# Patient Record
Sex: Female | Born: 1950 | Race: White | Hispanic: No | Marital: Married | Smoking: Never smoker
Health system: Southern US, Community
[De-identification: ages and names within clinical notes are randomized; demographics above are authoritative.]

## PROBLEM LIST (undated history)

## (undated) DIAGNOSIS — T7840XA Allergy, unspecified, initial encounter: Secondary | ICD-10-CM

## (undated) DIAGNOSIS — J309 Allergic rhinitis, unspecified: Secondary | ICD-10-CM

## (undated) DIAGNOSIS — H269 Unspecified cataract: Secondary | ICD-10-CM

## (undated) DIAGNOSIS — M1990 Unspecified osteoarthritis, unspecified site: Secondary | ICD-10-CM

## (undated) DIAGNOSIS — D649 Anemia, unspecified: Secondary | ICD-10-CM

## (undated) DIAGNOSIS — R011 Cardiac murmur, unspecified: Secondary | ICD-10-CM

## (undated) HISTORY — PX: TOTAL ABDOMINAL HYSTERECTOMY W/ BILATERAL SALPINGOOPHORECTOMY: SHX83

## (undated) HISTORY — DX: Allergy, unspecified, initial encounter: T78.40XA

## (undated) HISTORY — PX: BREAST LUMPECTOMY: SHX2

## (undated) HISTORY — DX: Anemia, unspecified: D64.9

## (undated) HISTORY — DX: Cardiac murmur, unspecified: R01.1

## (undated) HISTORY — DX: Unspecified cataract: H26.9

## (undated) HISTORY — DX: Allergic rhinitis, unspecified: J30.9

## (undated) HISTORY — DX: Unspecified osteoarthritis, unspecified site: M19.90

## (undated) HISTORY — PX: EYE SURGERY: SHX253

## (undated) HISTORY — PX: ABDOMINAL HYSTERECTOMY: SHX81

## (undated) HISTORY — PX: MYRINGOTOMY: SHX2060

---

## 1998-06-11 ENCOUNTER — Other Ambulatory Visit: Admission: RE | Admit: 1998-06-11 | Discharge: 1998-06-11 | Payer: Self-pay | Admitting: Obstetrics and Gynecology

## 1998-12-31 ENCOUNTER — Emergency Department (HOSPITAL_COMMUNITY): Admission: EM | Admit: 1998-12-31 | Discharge: 1998-12-31 | Payer: Self-pay | Admitting: Unknown Physician Specialty

## 1999-06-10 ENCOUNTER — Encounter: Admission: RE | Admit: 1999-06-10 | Discharge: 1999-06-10 | Payer: Self-pay | Admitting: Obstetrics and Gynecology

## 1999-06-10 ENCOUNTER — Encounter: Payer: Self-pay | Admitting: Obstetrics and Gynecology

## 1999-06-14 ENCOUNTER — Other Ambulatory Visit: Admission: RE | Admit: 1999-06-14 | Discharge: 1999-06-14 | Payer: Self-pay | Admitting: Obstetrics and Gynecology

## 2000-06-12 ENCOUNTER — Encounter: Payer: Self-pay | Admitting: Obstetrics and Gynecology

## 2000-06-12 ENCOUNTER — Encounter: Admission: RE | Admit: 2000-06-12 | Discharge: 2000-06-12 | Payer: Self-pay | Admitting: Obstetrics and Gynecology

## 2000-06-15 ENCOUNTER — Encounter: Payer: Self-pay | Admitting: Obstetrics and Gynecology

## 2000-06-15 ENCOUNTER — Encounter: Admission: RE | Admit: 2000-06-15 | Discharge: 2000-06-15 | Payer: Self-pay | Admitting: Obstetrics and Gynecology

## 2000-06-19 ENCOUNTER — Other Ambulatory Visit: Admission: RE | Admit: 2000-06-19 | Discharge: 2000-06-19 | Payer: Self-pay | Admitting: Family Medicine

## 2001-06-21 ENCOUNTER — Encounter: Payer: Self-pay | Admitting: Obstetrics and Gynecology

## 2001-06-21 ENCOUNTER — Encounter: Admission: RE | Admit: 2001-06-21 | Discharge: 2001-06-21 | Payer: Self-pay | Admitting: Obstetrics and Gynecology

## 2001-07-23 ENCOUNTER — Other Ambulatory Visit: Admission: RE | Admit: 2001-07-23 | Discharge: 2001-07-23 | Payer: Self-pay | Admitting: Obstetrics and Gynecology

## 2001-12-06 ENCOUNTER — Ambulatory Visit (HOSPITAL_COMMUNITY): Admission: RE | Admit: 2001-12-06 | Discharge: 2001-12-06 | Payer: Self-pay | Admitting: Gastroenterology

## 2001-12-06 ENCOUNTER — Encounter (INDEPENDENT_AMBULATORY_CARE_PROVIDER_SITE_OTHER): Payer: Self-pay | Admitting: *Deleted

## 2002-05-20 ENCOUNTER — Other Ambulatory Visit: Admission: RE | Admit: 2002-05-20 | Discharge: 2002-05-20 | Payer: Self-pay | Admitting: Obstetrics and Gynecology

## 2002-07-09 ENCOUNTER — Encounter: Payer: Self-pay | Admitting: Obstetrics and Gynecology

## 2002-07-09 ENCOUNTER — Encounter: Admission: RE | Admit: 2002-07-09 | Discharge: 2002-07-09 | Payer: Self-pay | Admitting: Obstetrics and Gynecology

## 2002-08-22 ENCOUNTER — Ambulatory Visit (HOSPITAL_COMMUNITY): Admission: RE | Admit: 2002-08-22 | Discharge: 2002-08-22 | Payer: Self-pay | Admitting: *Deleted

## 2002-08-22 ENCOUNTER — Encounter (INDEPENDENT_AMBULATORY_CARE_PROVIDER_SITE_OTHER): Payer: Self-pay | Admitting: *Deleted

## 2003-07-31 ENCOUNTER — Encounter: Admission: RE | Admit: 2003-07-31 | Discharge: 2003-07-31 | Payer: Self-pay | Admitting: Obstetrics and Gynecology

## 2003-08-04 ENCOUNTER — Encounter: Admission: RE | Admit: 2003-08-04 | Discharge: 2003-08-04 | Payer: Self-pay | Admitting: Obstetrics and Gynecology

## 2003-08-06 ENCOUNTER — Other Ambulatory Visit: Admission: RE | Admit: 2003-08-06 | Discharge: 2003-08-06 | Payer: Self-pay | Admitting: Obstetrics and Gynecology

## 2003-09-22 ENCOUNTER — Inpatient Hospital Stay (HOSPITAL_COMMUNITY): Admission: RE | Admit: 2003-09-22 | Discharge: 2003-09-25 | Payer: Self-pay | Admitting: Obstetrics and Gynecology

## 2003-09-22 ENCOUNTER — Encounter (INDEPENDENT_AMBULATORY_CARE_PROVIDER_SITE_OTHER): Payer: Self-pay | Admitting: Specialist

## 2004-03-11 ENCOUNTER — Ambulatory Visit: Payer: Self-pay | Admitting: Family Medicine

## 2004-08-19 ENCOUNTER — Encounter: Admission: RE | Admit: 2004-08-19 | Discharge: 2004-08-19 | Payer: Self-pay | Admitting: Obstetrics and Gynecology

## 2005-02-17 ENCOUNTER — Ambulatory Visit: Payer: Self-pay | Admitting: Gastroenterology

## 2005-02-24 ENCOUNTER — Ambulatory Visit: Payer: Self-pay | Admitting: Gastroenterology

## 2005-02-24 LAB — HM COLONOSCOPY: HM Colonoscopy: NORMAL

## 2005-07-25 ENCOUNTER — Ambulatory Visit: Payer: Self-pay | Admitting: Family Medicine

## 2005-08-21 ENCOUNTER — Encounter: Admission: RE | Admit: 2005-08-21 | Discharge: 2005-08-21 | Payer: Self-pay | Admitting: Obstetrics and Gynecology

## 2006-02-05 ENCOUNTER — Ambulatory Visit: Payer: Self-pay | Admitting: Family Medicine

## 2006-08-24 ENCOUNTER — Encounter: Admission: RE | Admit: 2006-08-24 | Discharge: 2006-08-24 | Payer: Self-pay | Admitting: Obstetrics and Gynecology

## 2007-07-09 ENCOUNTER — Ambulatory Visit: Payer: Self-pay | Admitting: Family Medicine

## 2007-07-09 DIAGNOSIS — J309 Allergic rhinitis, unspecified: Secondary | ICD-10-CM | POA: Insufficient documentation

## 2007-08-26 ENCOUNTER — Encounter: Admission: RE | Admit: 2007-08-26 | Discharge: 2007-08-26 | Payer: Self-pay | Admitting: Obstetrics and Gynecology

## 2007-11-22 ENCOUNTER — Ambulatory Visit: Payer: Self-pay | Admitting: Family Medicine

## 2007-11-22 LAB — CONVERTED CEMR LAB
Bilirubin Urine: NEGATIVE
Blood in Urine, dipstick: NEGATIVE
Glucose, Urine, Semiquant: NEGATIVE
Ketones, urine, test strip: NEGATIVE
Nitrite: NEGATIVE
pH: 7.5

## 2007-11-24 ENCOUNTER — Telehealth (INDEPENDENT_AMBULATORY_CARE_PROVIDER_SITE_OTHER): Payer: Self-pay | Admitting: *Deleted

## 2007-11-24 LAB — CONVERTED CEMR LAB
Bilirubin, Direct: 0.1 mg/dL (ref 0.0–0.3)
CO2: 29 meq/L (ref 19–32)
Calcium: 8.9 mg/dL (ref 8.4–10.5)
Chloride: 100 meq/L (ref 96–112)
GFR calc non Af Amer: 110 mL/min
Glucose, Bld: 83 mg/dL (ref 70–99)
Hemoglobin: 14.6 g/dL (ref 12.0–15.0)
Lymphocytes Relative: 24.1 % (ref 12.0–46.0)
Monocytes Relative: 9.3 % (ref 3.0–12.0)
Platelets: 249 10*3/uL (ref 150–400)
Potassium: 3.7 meq/L (ref 3.5–5.1)
RBC: 4.75 M/uL (ref 3.87–5.11)
RDW: 13.3 % (ref 11.5–14.6)
Total Bilirubin: 0.9 mg/dL (ref 0.3–1.2)
Total CHOL/HDL Ratio: 2
Triglycerides: 37 mg/dL (ref 0–149)
VLDL: 7 mg/dL (ref 0–40)
WBC: 4.8 10*3/uL (ref 4.5–10.5)

## 2007-12-05 ENCOUNTER — Encounter (INDEPENDENT_AMBULATORY_CARE_PROVIDER_SITE_OTHER): Payer: Self-pay | Admitting: *Deleted

## 2007-12-05 ENCOUNTER — Ambulatory Visit: Payer: Self-pay | Admitting: Family Medicine

## 2007-12-05 LAB — CONVERTED CEMR LAB: OCCULT 2: NEGATIVE

## 2008-01-29 ENCOUNTER — Telehealth (INDEPENDENT_AMBULATORY_CARE_PROVIDER_SITE_OTHER): Payer: Self-pay | Admitting: *Deleted

## 2008-01-31 ENCOUNTER — Telehealth (INDEPENDENT_AMBULATORY_CARE_PROVIDER_SITE_OTHER): Payer: Self-pay | Admitting: *Deleted

## 2008-08-17 ENCOUNTER — Telehealth (INDEPENDENT_AMBULATORY_CARE_PROVIDER_SITE_OTHER): Payer: Self-pay | Admitting: *Deleted

## 2008-09-11 ENCOUNTER — Encounter: Admission: RE | Admit: 2008-09-11 | Discharge: 2008-09-11 | Payer: Self-pay | Admitting: Obstetrics and Gynecology

## 2008-11-27 ENCOUNTER — Ambulatory Visit: Payer: Self-pay | Admitting: Family Medicine

## 2008-11-27 DIAGNOSIS — J019 Acute sinusitis, unspecified: Secondary | ICD-10-CM

## 2008-11-27 DIAGNOSIS — R05 Cough: Secondary | ICD-10-CM

## 2008-11-30 ENCOUNTER — Ambulatory Visit (HOSPITAL_BASED_OUTPATIENT_CLINIC_OR_DEPARTMENT_OTHER): Admission: RE | Admit: 2008-11-30 | Discharge: 2008-11-30 | Payer: Self-pay | Admitting: Family Medicine

## 2008-11-30 ENCOUNTER — Ambulatory Visit: Payer: Self-pay | Admitting: Radiology

## 2008-11-30 ENCOUNTER — Telehealth (INDEPENDENT_AMBULATORY_CARE_PROVIDER_SITE_OTHER): Payer: Self-pay | Admitting: *Deleted

## 2009-02-15 ENCOUNTER — Ambulatory Visit: Payer: Self-pay | Admitting: Family Medicine

## 2009-03-01 ENCOUNTER — Encounter (INDEPENDENT_AMBULATORY_CARE_PROVIDER_SITE_OTHER): Payer: Self-pay | Admitting: *Deleted

## 2009-03-01 LAB — CONVERTED CEMR LAB
Basophils Absolute: 0 10*3/uL (ref 0.0–0.1)
Bilirubin, Direct: 0 mg/dL (ref 0.0–0.3)
CO2: 28 meq/L (ref 19–32)
Creatinine, Ser: 0.6 mg/dL (ref 0.4–1.2)
Eosinophils Absolute: 0.2 10*3/uL (ref 0.0–0.7)
Eosinophils Relative: 2.2 % (ref 0.0–5.0)
Glucose, Bld: 97 mg/dL (ref 70–99)
HCT: 39.6 % (ref 36.0–46.0)
HDL: 75.2 mg/dL (ref >39.00)
LDL Cholesterol: 75 mg/dL (ref 0–99)
Lymphs Abs: 0.9 10*3/uL (ref 0.7–4.0)
MCHC: 34.6 g/dL (ref 30.0–36.0)
Monocytes Relative: 6.9 % (ref 3.0–12.0)
Neutro Abs: 6.9 10*3/uL (ref 1.4–7.7)
Platelets: 224 10*3/uL (ref 150.0–400.0)
Potassium: 4.3 meq/L (ref 3.5–5.1)
RBC: 4.26 M/uL (ref 3.87–5.11)
TSH: 1.75 microintl units/mL (ref 0.35–5.50)
Total CHOL/HDL Ratio: 2
Total Protein: 6.5 g/dL (ref 6.0–8.3)
VLDL: 6.6 mg/dL (ref 0.0–40.0)

## 2009-03-16 ENCOUNTER — Ambulatory Visit: Payer: Self-pay | Admitting: Family Medicine

## 2009-03-16 LAB — CONVERTED CEMR LAB
OCCULT 2: NEGATIVE
OCCULT 3: NEGATIVE

## 2009-09-17 ENCOUNTER — Encounter: Admission: RE | Admit: 2009-09-17 | Discharge: 2009-09-17 | Payer: Self-pay | Admitting: Obstetrics and Gynecology

## 2010-01-28 ENCOUNTER — Ambulatory Visit: Payer: Self-pay | Admitting: Family Medicine

## 2010-03-30 ENCOUNTER — Ambulatory Visit: Payer: Self-pay | Admitting: Family Medicine

## 2010-05-24 NOTE — Assessment & Plan Note (Signed)
Summary: sore throat.temp/congested/cbs   Vital Signs:  Patient profile:   59 year old female Weight:      153 pounds BMI:     24.78 Temp:     99.7 degrees F oral BP sitting:   128 / 82  (left arm)  Vitals Entered By: Doristine Devoid CMA (March 30, 2010 4:29 PM) CC: sinus congestion and pressure along w/ HA and cough   History of Present Illness: 60 yo woman here today for ? sinus infxn.  started as a dry cough last week.  by Monday had increased head congestion.  yesterday lost her voice.  now w/ chills and facial pain.  no tooth pain.  + ear clogged.  sore throat when coughing.  cough is increasingly more productive.  + sick contacts.  Current Medications (verified): 1)  Adult Aspirin Low Strength 81 Mg  Tbdp (Aspirin) .[name].. 1 By Mouth Once Daily 2)  Flonase 50 Mcg/act  Susp (Fluticasone Propionate) .[name].. 1 Spray Each Nostril Two Times A Day 3)  Alora 0.075 Mg/24hr Pttw (Estradiol) .... As Directed  Allergies (verified): 1)  ! Sulfa  Review of Systems      See HPI  Physical Exam  General:  Well-developed,well-nourished,in no acute distress; alert,appropriate and cooperative throughout examination Head:  Normocephalic and atraumatic without obvious abnormalities. No apparent alopecia or balding.  + TTP over maxillary sinuses Eyes:  no injxn or inflammation Ears:  TMs retracted bilaterally Nose:  + congestion Mouth:  + PND Neck:  No deformities, masses, or tenderness noted. Lungs:  Normal respiratory effort, chest expands symmetrically. Lungs are clear to auscultation, no crackles or wheezes.  hacking cough Heart:  Normal rate and regular rhythm. S1 and S2 normal without gallop, murmur, click, rub or other extra sounds.   Impression & Recommendations:  Problem # 1:  ACUTE SINUSITIS, UNSPECIFIED (ICD-461.9) Assessment Unchanged start Zpack at pt's request.  cough meds as needed.  reviewed supportive care and red flags that should prompt return.  Pt expresses understanding  and is in agreement w/ this plan. Her updated medication list for this problem includes:    Flonase 50 Mcg/act Susp (Fluticasone propionate) .[name]... 1 spray each nostril two times a day    Azithromycin 250 Mg Tabs (Azithromycin) .[name]... 2 by  mouth today and then 1 daily for 4 days    Tessalon 200 Mg Caps (Benzonatate) .[name]... Take one capsule by mouth three times a day as needed for cough    Cheratussin Ac 100-10 Mg/66ml Syrp (Guaifenesin-codeine) .[name]... 1-2 tsps q4-6 as needed for cough  Complete Medication List: 1)  Adult Aspirin Low Strength 81 Mg Tbdp (Aspirin) .[name].. 1 by mouth once daily 2)  Flonase 50 Mcg/act Susp (Fluticasone propionate) .[name].. 1 spray each nostril two times a day 3)  Alora 0.075 Mg/24hr Pttw (Estradiol) .... As directed 4)  Azithromycin 250 Mg Tabs (Azithromycin) .... 2 by  mouth today and then 1 daily for 4 days 5)  Tessalon 200 Mg Caps (Benzonatate) .... Take one capsule by mouth three times a day as needed for cough 6)  Cheratussin Ac 100-10 Mg/62ml Syrp (Guaifenesin-codeine) .[name].. 1-2 tsps q4-6 as needed for cough  Patient Instructions: 1)  This is a sinus infection- take the Azithromycin as directed 2)  If no improvement in the next 7-10 days or at any time worsening- please call 3)  Drink plenty of fluids 4)  Alternate tylenol and ibuprofen for pain and fever 5)  REST! 6)  Tessalon for daytime  cough and codeine syrup for night 7)  Mucinex to break up your congestion 8)  Hang in there!!! Prescriptions: CHERATUSSIN AC 100-10 MG/5ML SYRP (GUAIFENESIN-CODEINE) 1-2 tsps Q4-6 as needed for cough  #142ml x 0   Entered and Authorized by:   Neena Rhymes MD   Signed by:   Neena Rhymes MD on 03/30/2010   Method used:   Print then Give to Patient   RxID:   [id number] TESSALON 200 MG CAPS (BENZONATATE) Take one capsule by mouth three times a day as needed for cough  #60 x 0   Entered and Authorized by:   Neena Rhymes MD   Signed by:   Neena Rhymes MD on  03/30/2010   Method used:   Electronically to        [facility] [address] [id number]* (retail)       [address]       [place], [place]  [address]       Ph: [phone]       Fax: [phone]   RxID:   [id number] AZITHROMYCIN 250 MG  TABS (AZITHROMYCIN) 2 by  mouth today and then 1 daily for 4 days  #6 x 0   Entered and Authorized by:   Neena Rhymes MD   Signed by:   Neena Rhymes MD on 03/30/2010   Method used:   Electronically to        [facility] [address] [facility] [id number]* (retail)       [address]       [place], [place]  [address]       Ph: [phone]       Fax: [phone]   RxID:   [id number]    Orders Added: 1)  Est. Patient Level III [25427]

## 2010-05-24 NOTE — Assessment & Plan Note (Signed)
Summary: STARTING NEW JOB WITH [employer]---NEEDS FORM FILLED,...   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  No complaints.  Current Medications (verified): 1)  Adult Aspirin Low Strength 81 Mg  Tbdp (Aspirin) .[name].. 1 By Mouth Once Daily 2)  Flonase 50 Mcg/act  Susp (Fluticasone Propionate) .[name].. 1 Spray Each Nostril Two Times A Day 3)  Alora 0.075 Mg/24hr Pttw (Estradiol) .... As Directed  Allergies (verified): 1)  ! Sulfa  Family History: Reviewed history from 07/09/2007 and no changes required. MOTHER-LIVING FATHER-DECEASED 2 SISTERS: LIVING 1 BROTHER: LIVING HEART-MOTHER (A-FIB) Family History Breast cancer 1st degree relative <50: MOTHER (AGE 59)  Family History of Prostate CA 1st degree relative <50; FATHER  OSTEOARTHRITIS-MOTHER AGE 48   Family History Thyroid disease: MOTHER (HYPO) BROTHER, 1 SISTER  Social History: Reviewed history from 11/22/2007 and no changes required. 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 No apparent alopecia or balding. Eyes:  vision grossly intact, pupils equal, pupils round, pupils reactive to light, and no injection.   Ears:  External ear exam shows no significant lesions or deformities.  Otoscopic  examination reveals clear canals, tympanic membranes are intact bilaterally without bulging, retraction, inflammation or discharge. Hearing is grossly normal bilaterally. Nose:  External nasal examination shows no deformity or inflammation. Nasal mucosa are pink and moist without lesions or exudates. Mouth:  Oral mucosa and oropharynx without lesions or exudates.  Teeth in good repair. Neck:  No deformities, masses, or tenderness noted. Lungs:  Normal respiratory effort, chest expands symmetrically. Lungs are clear to auscultation, no crackles or wheezes. Heart:  Normal rate and regular rhythm. S1 and S2 normal without gallop, murmur, click, rub or other extra sounds. Extremities:  No clubbing, cyanosis, edema, or deformity noted with normal full range of motion of all joints.   Skin:  Intact without suspicious lesions or rashes Psych:  Cognition and judgment appear intact. Alert and cooperative with normal attention span and concentration. No apparent delusions, illusions, hallucinations   Impression & Recommendations:  Problem # 1:  PREVENTIVE HEALTH CARE (ICD-V70.0) school form filled out immun utd per pt  Orders: Venipuncture (08657)  Complete Medication List: 1)  Adult Aspirin Low Strength 81 Mg Tbdp (Aspirin) .[name].. 1 by mouth once daily 2)  Flonase 50 Mcg/act Susp (Fluticasone propionate) .[name].. 1 spray each nostril two times a day 3)  Alora 0.075 Mg/24hr Pttw (Estradiol) .... As directed Prescriptions: FLONASE 50 MCG/ACT  SUSP (FLUTICASONE PROPIONATE) 1 spray each nostril two times a day  #1 x 11   Entered and Authorized by:   Loreen Freud DO   Signed by:   Loreen Freud DO on 01/28/2010   Method  used:   Print then Give to Patient   RxID:   [id number]

## 2010-09-09 NOTE — Op Note (Signed)
NAME:  Laura Price, Laura Price                    ACCOUNT NO.:  [id number]   MEDICAL RECORD NO.:  [id number]                   PATIENT TYPE:  INP   LOCATION:  9399                                 FACILITY:  [facility]   PHYSICIAN:  Miguel Aschoff, M.D.                    DATE OF BIRTH:  23-Sep-1950   DATE OF PROCEDURE:  09/22/2003  DATE OF DISCHARGE:                                 OPERATIVE REPORT   PREOPERATIVE DIAGNOSIS:  1. Systematic uterine fibroids.  2. Pelvic pressure and pain.   POSTOPERATIVE DIAGNOSES:  1. Symptomatic uterine fibroids.  2. Pelvic pressure and pain.   PROCEDURE:  1. Total abdominal hysterectomy.  2. Right salpingo-oophorectomy.   SURGEON:  Miguel Aschoff, M.D.   ASSISTANT:  Carrington Clamp, M.D.   ANESTHESIA:  General.   COMPLICATIONS:  None.   JUSTIFICATION:  The patient is a 60 year old white female with a history of  lower abdominal pressure and pain.  Under examination she is noted to hae  significant uterine fibroids, approximately 14-16 week size.  In spite  efforts to control her pain conservatively with medication, it is now  reached the point where she requests that definitive procedure be carried.  She now presents to undergo total abdominal hysterectomy as well as right  salpingo-oophorectomy.  The risks and benefits of this procedure were  discussed with the patient.   DESCRIPTION OF PROCEDURE:  The patient was taken to the operating room,  placed in the supine position.  General anesthesia was administered without  difficulty.  She was prepped and draped in the usual sterile fashion.  A  Foley catheter was inserted.  A previous Pfannenstiel skin incision was then  reincised, and the incision was extended up through the subcutaneous tissue.  Bleeding points were clamped and coagulated as they were encountered.  The  fascia was then identified and incised transversely, separated from the  underlying rectus muscles.  The rectus muscles were divided in  the midline.  The peritoneum was then identified and entered, taking care to avoid the  underlying structures.  Peritoneal incision was extended under direct  visualization.   After this was done, the viscera was packed out of the pelvis.  A self-  retaining retractor was placed through the wound.  The round ligaments were  then identified, clamped, cut and suture ligated using suture ligatures of 0  Vicryl.  Bladder flap was then created anteriorly.   At this point, __________ was made below the infundibulopelvic ligament.  The ureters were identified and then the ligament was clamped, cut and  suture ligated using two suture ligatures of 0 Vicryl.  On the left side  where the patient's ovary had been removed, a clamp was placed across the  cornual portion of the uterus.  This pedicle was cut and suture ligated  using suture ligature of 0 Vicryl.  Then the broad ligament was  skeletonized.  The uterine vessels  were identified; clamped with curved  Heaney clamps.  These pedicles were cut and suture ligated using suture  ligatures of 0 Vicryl.  A straight Heaney clamp and additional bites were  taken up the deep paracervical fascia.  All pedicles were cut and suture  ligated using suture ligatures of 0 Vicryl.  The uterosacral ligaments were  then found, clamped with curved Heaney clamps; pedicles cut and suture  ligated using suture ligatures of 0 Vicryl.  The cardinal ligaments were  clamped, cut and suture ligated in a similar fashion.   At this point the vaginal vault was entered and the specimen was cut free.  The specimen consisted of the cervix, uterus, right tube and ovary.   At this point, angles of the vaginal cuff were ligated using figure-of-eight  sutures of 0 Vicryl.  The uterosacral ligaments were incorporated for  support.  The cuff was then closed using running, interlocking 0 Vicryl  suture.  Inspection was made for hemostasis, and hemostasis appeared to be   excellent.  At this point the pelvic floor was reapproximated _________using  running, continuous 0 Vicryl suture.   At this point lap counts and instrument counts were found to be correct.  Then the abdomen was closed.  The parietoperitoneum was closed using  running, continuous 0 Vicryl suture.  Rectus muscles were reapproximated  using running continuous 0 Vicryl suture.  The fascia was then closed using  two sutures of 0 Vicryl, each starting at the lateral fascia and meeting in  the midline.  The subcutaneous tissue was closed using interrupted 0 Vicryl  suture.  The skin incision was closed using staples.   ESTIMATED BLOOD LOSS:  Approximately 150 cc.   DISPOSITION:  The patient tolerated the procedure well, and went to the  recovery room in satisfactory condition.                                               Miguel Aschoff, M.D.    AR/MEDQ  D:  09/22/2003  T:  09/22/2003  Job:  [id number]

## 2010-09-09 NOTE — Op Note (Signed)
NAME:  Laura Price, Laura Price                    ACCOUNT NO.:  [id number]   MEDICAL RECORD NO.:  [id number]                   PATIENT TYPE:  AMB   LOCATION:  SDC                                  FACILITY:  [facility]   PHYSICIAN:  Miguel Aschoff, M.D.                    DATE OF BIRTH:  07/21/1950   DATE OF PROCEDURE:  08/22/2002  DATE OF DISCHARGE:                                 OPERATIVE REPORT   PREOPERATIVE DIAGNOSES:  1. Irregular vaginal bleeding.  2. Uterine fibroids.   POSTOPERATIVE DIAGNOSES:  1. Irregular vaginal bleeding.  2. Uterine fibroids.   PROCEDURES:  1. Cervical dilatation.  2. Hysteroscopy.  3. Uterine curettage.   SURGEON:  Miguel Aschoff, M.D.   ANESTHESIA:  IV sedation with paracervical block.   COMPLICATIONS:  None.   JUSTIFICATION:  The patient is a 60 year old white female with a history of  irregular vaginal bleeding and known uterine fibroids.  The patient had been  treated with Prometrium therapy in an effort to regulate her cycle; however,  she had continued with irregular bleeding in spite of these progestational  agents.  She presents now to undergo hysteroscopy and D&C in an effort to  diagnose the etiology of the irregular bleeding, to rule out any endometrial  dysplasia, and see if the bleeding can be regulated.  Risks and benefits of  the procedure were discussed with the patient.   DESCRIPTION OF PROCEDURE:  The patient was taken to the operating room and  placed in the supine position, and IV sedation was administered without  difficulty.  She was then placed in the dorsal lithotomy position, prepped  and draped in then usual sterile fashion.  The bladder was catheterized.  Once this was done, examination under anesthesia was carried out, which  revealed normal external genitalia, normal Bartholin's and Skene's gland,  normal urethra.  The vaginal vault was without gross lesions.  The cervix  was without gross lesion.  The uterus was noted to be  irregular in shape,  approximately 10-12 weeks in size.  The findings in the uterus were  consistent with multiple uterine fibroids.  The adnexa revealed no definite  masses.  At this point the speculum was placed in the vaginal vault, the  anterior cervical lip was grasped with a tenaculum, and then the cervix was  injected with 18 mL of 1% Xylocaine by placing 6 mL at the 12, 4, and 6  o'clock positions.  Once this was done using serial Pratt dilators, the  endocervical canal was dilated.  When this was done the diagnostic  hysteroscope was advanced through the cervix and placed into the uterine  cavity.  Visualization was somewhat difficult, but the cavity appeared to be  irregular, being consistent with uterine fibroids with submucous myomas.  At  this point the hysteroscope was removed and a medium-sized serrated curette  was used and vigorous curettage was carried out  in an attempt to remove all  the endometrium and as much of the submucous myomas as possible.  A large  amount of tissue was obtained, and this was sent for histologic study.  Once  the cavity appeared to be smooth, this portion of the procedure was  completed.  At this point all instruments were removed, hemostasis was  readily achieved.  The patient was reversed from the anesthetic and taken to  the recovery room in satisfactory condition.  The estimated blood loss was  approximately 50-75 mL.  The patient tolerated the procedure well.   The plan is for the patient to be discharged home.  Medications for home  include doxycycline 100 mg twice a day x3 days, Darvocet-N 100 one every  four hours as needed for pain.  She is instructed to put nothing in the  vagina, to call if there are any problems such as fever, pain, or heavy  bleeding, and to call for the pathology report on 08/27/02.  She will be seen  back in four weeks for follow-up examination.  Medications or further  therapy will be dependent upon the pathology  report.                                               Miguel Aschoff, M.D.    AR/MEDQ  D:  08/22/2002  T:  08/22/2002  Job:  [id number]

## 2010-09-09 NOTE — Discharge Summary (Signed)
NAME:  Laura Price, Laura Price                    ACCOUNT NO.:  [id number]   MEDICAL RECORD NO.:  [id number]                   PATIENT TYPE:  INP   LOCATION:  9307                                 FACILITY:  [facility]   PHYSICIAN:  Miguel Aschoff, M.D.                    DATE OF BIRTH:  Jan 18, 1951   DATE OF ADMISSION:  09/22/2003  DATE OF DISCHARGE:  09/25/2003                                 DISCHARGE SUMMARY   ADMISSION DIAGNOSES:  Symptomatic uterine fibroids and pelvic pain.   OPERATIONS AND PROCEDURES:  Total abdominal hysterectomy, right salpingo-  oophorectomy.   BRIEF HISTORY:  The patient is a 60 year old white female with history of  uterine fibroids that have been treated conservatively.  The patient  presents to the office with the onset of worsening low abdominal pain and  pressure and on examination was noted to have 16-week size uterine fibroids.  In view of the symptomatology associated with these large fibroids, she  requested a definitive procedure be carried out to deal with them and she  was admitted to the hospital to undergo a total abdominal hysterectomy and  right salpingo-oophorectomy.  She previously had a left salpingo-  oophorectomy due to a dermoid cyst.   HOSPITAL COURSE:  The patient had admission hemoglobin of 12.7, hematocrit  39.6, white count 5700.  Chemistry profile was essentially within normal  limits.  Urinalysis was essentially within normal limits.  On May 31st under  general anesthesia, a total abdominal hysterectomy and right salpingo-  oophorectomy were carried out without difficulty.  The uterine specimen  weighed 478 grams and had multiple leiomyomas present.  The patient's  postoperative course was essentially uncomplicated.  She tolerated  increasing ambulation and diet well.  Her hemoglobin, however, did drop down  to 9.3.  By June 3rd, the patient was ambulating well and able to take p.o.  pain medications and was able to be discharged home.  Her  medications for  home included Demerol 50 mg p.o. q.3 h. p.r.n. pain, Motrin 600 mg p.o. q.6  h. p.r.n. pain, Slow Fe one daily.  She was instructed to do no heavy  lifting, and place nothing in the vagina, and call if there are any problems  such as fever, pain, or heavy bleeding.  She was to be seen back in 4 weeks  for follow up examination.  Pathology report on the operative specimens  reveals the cervix was squamous metaplasia, proliferative endometrium,  leiomyomata with focal degenerative changes with the presence of submucosal,  intramural, and subserosal leiomyomas present.  The right ovary showed a  benign follicular cyst.  The tubes were benign.                                               Miguel Aschoff,  M.D.    AR/MEDQ  D:  10/03/2003  T:  10/03/2003  Job:  [id number]

## 2010-09-21 ENCOUNTER — Other Ambulatory Visit: Payer: Self-pay | Admitting: Obstetrics and Gynecology

## 2010-09-21 DIAGNOSIS — Z1231 Encounter for screening mammogram for malignant neoplasm of breast: Secondary | ICD-10-CM

## 2010-10-05 ENCOUNTER — Ambulatory Visit
Admission: RE | Admit: 2010-10-05 | Discharge: 2010-10-05 | Disposition: A | Discharge status: Home / Self Care | Payer: BC Managed Care – PPO | Source: Ambulatory Visit | Attending: Obstetrics and Gynecology | Admitting: Obstetrics and Gynecology

## 2010-10-05 DIAGNOSIS — Z1231 Encounter for screening mammogram for malignant neoplasm of breast: Secondary | ICD-10-CM

## 2010-10-07 ENCOUNTER — Other Ambulatory Visit: Payer: Self-pay | Admitting: Obstetrics and Gynecology

## 2010-10-07 DIAGNOSIS — R928 Other abnormal and inconclusive findings on diagnostic imaging of breast: Secondary | ICD-10-CM

## 2010-10-20 ENCOUNTER — Ambulatory Visit
Admission: RE | Admit: 2010-10-20 | Discharge: 2010-10-20 | Disposition: A | Discharge status: Home / Self Care | Payer: BC Managed Care – PPO | Source: Ambulatory Visit | Attending: Obstetrics and Gynecology | Admitting: Obstetrics and Gynecology

## 2010-10-20 DIAGNOSIS — R928 Other abnormal and inconclusive findings on diagnostic imaging of breast: Secondary | ICD-10-CM

## 2011-06-22 ENCOUNTER — Other Ambulatory Visit: Payer: Self-pay | Admitting: Family Medicine

## 2011-06-22 NOTE — Telephone Encounter (Signed)
Last seen 03/30/10 with Dr.Tabori and no upcoming apts. Please advise   KP

## 2011-10-09 ENCOUNTER — Other Ambulatory Visit: Payer: Self-pay | Admitting: Obstetrics and Gynecology

## 2011-10-09 DIAGNOSIS — Z1231 Encounter for screening mammogram for malignant neoplasm of breast: Secondary | ICD-10-CM

## 2011-10-10 ENCOUNTER — Encounter: Payer: Self-pay | Admitting: Family Medicine

## 2011-10-10 ENCOUNTER — Ambulatory Visit (INDEPENDENT_AMBULATORY_CARE_PROVIDER_SITE_OTHER): Payer: BC Managed Care – PPO | Admitting: Family Medicine

## 2011-10-10 VITALS — BP 116/72 | HR 59 | Temp 98.1°F | Ht 66.25 in | Wt 154.4 lb

## 2011-10-10 DIAGNOSIS — Z0000 Encounter for general adult medical examination without abnormal findings: Secondary | ICD-10-CM

## 2011-10-10 LAB — CBC WITH DIFFERENTIAL/PLATELET
Basophils Absolute: 0 10*3/uL (ref 0.0–0.1)
Hemoglobin: 14 g/dL (ref 12.0–15.0)
Lymphocytes Relative: 20.7 % (ref 12.0–46.0)
MCHC: 33.2 g/dL (ref 30.0–36.0)
MCV: 89.2 fl (ref 78.0–100.0)
Monocytes Relative: 8.3 % (ref 3.0–12.0)
Platelets: 235 10*3/uL (ref 150.0–400.0)
RBC: 4.73 Mil/uL (ref 3.87–5.11)
RDW: 14.1 % (ref 11.5–14.6)

## 2011-10-10 LAB — POCT URINALYSIS DIPSTICK
Glucose, UA: NEGATIVE
Leukocytes, UA: NEGATIVE
Nitrite, UA: NEGATIVE
Spec Grav, UA: <=1.005

## 2011-10-10 LAB — BASIC METABOLIC PANEL
GFR: 151.05 mL/min (ref >60.00)
Sodium: 138 mEq/L (ref 135–145)

## 2011-10-10 LAB — LIPID PANEL
Cholesterol: 179 mg/dL (ref 0–200)
Total CHOL/HDL Ratio: 2
Triglycerides: 33 mg/dL (ref 0.0–149.0)
VLDL: 6.6 mg/dL (ref 0.0–40.0)

## 2011-10-10 LAB — HEPATIC FUNCTION PANEL
ALT: 16 U/L (ref 0–35)
AST: 17 U/L (ref 0–37)
Total Protein: 7.1 g/dL (ref 6.0–8.3)

## 2011-10-10 MED ORDER — MEDICAL COMPRESSION STOCKINGS MISC: 1.0000 "application " | Status: DC | PRN

## 2011-10-10 MED ORDER — MEDICAL COMPRESSION STOCKINGS MISC
1.0000 | Status: DC | PRN
Start: 2011-10-10 — End: 2012-10-18

## 2011-10-10 NOTE — Patient Instructions (Addendum)
Preventive Care for Adults, Female A healthy lifestyle and preventive care can promote health and wellness. Preventive health guidelines for women include the following key practices.  A routine yearly physical is a good way to check with your caregiver about your health and preventive screening. It is a chance to share any concerns and updates on your health, and to receive a thorough exam.   Visit your dentist for a routine exam and preventive care every 6 months. Brush your teeth twice a day and floss once a day. Good oral hygiene prevents tooth decay and gum disease.   The frequency of eye exams is based on your age, health, family medical history, use of contact lenses, and other factors. Follow your caregiver's recommendations for frequency of eye exams.   Eat a healthy diet. Foods like vegetables, fruits, whole grains, low-fat dairy products, and lean protein foods contain the nutrients you need without too many calories. Decrease your intake of foods high in solid fats, added sugars, and salt. Eat the right amount of calories for you.Get information about a proper diet from your caregiver, if necessary.   Regular physical exercise is one of the most important things you can do for your health. Most adults should get at least 150 minutes of moderate-intensity exercise (any activity that increases your heart rate and causes you to sweat) each week. In addition, most adults need muscle-strengthening exercises on 2 or more days a week.   Maintain a healthy weight. The body mass index (BMI) is a screening tool to identify possible weight problems. It provides an estimate of body fat based on height and weight. Your caregiver can help determine your BMI, and can help you achieve or maintain a healthy weight.For adults 20 years and older:   A BMI below 18.5 is considered underweight.   A BMI of 18.5 to 24.9 is normal.   A BMI of 25 to 29.9 is considered overweight.   A BMI of 30 and above is  considered obese.   Maintain normal blood lipids and cholesterol levels by exercising and minimizing your intake of saturated fat. Eat a balanced diet with plenty of fruit and vegetables. Blood tests for lipids and cholesterol should begin at age 20 and be repeated every 5 years. If your lipid or cholesterol levels are high, you are over 50, or you are at high risk for heart disease, you may need your cholesterol levels checked more frequently.Ongoing high lipid and cholesterol levels should be treated with medicines if diet and exercise are not effective.   If you smoke, find out from your caregiver how to quit. If you do not use tobacco, do not start.   If you are pregnant, do not drink alcohol. If you are breastfeeding, be very cautious about drinking alcohol. If you are not pregnant and choose to drink alcohol, do not exceed 1 drink per day. One drink is considered to be 12 ounces (355 mL) of beer, 5 ounces (148 mL) of wine, or 1.5 ounces (44 mL) of liquor.   Avoid use of street drugs. Do not share needles with anyone. Ask for help if you need support or instructions about stopping the use of drugs.   High blood pressure causes heart disease and increases the risk of stroke. Your blood pressure should be checked at least every 1 to 2 years. Ongoing high blood pressure should be treated with medicines if weight loss and exercise are not effective.   If you are 55 to 60   years old, ask your caregiver if you should take aspirin to prevent strokes.   Diabetes screening involves taking a blood sample to check your fasting blood sugar level. This should be done once every 3 years, after age 45, if you are within normal weight and without risk factors for diabetes. Testing should be considered at a younger age or be carried out more frequently if you are overweight and have at least 1 risk factor for diabetes.   Breast cancer screening is essential preventive care for women. You should practice "breast  self-awareness." This means understanding the normal appearance and feel of your breasts and may include breast self-examination. Any changes detected, no matter how small, should be reported to a caregiver. Women in their 20s and 30s should have a clinical breast exam (CBE) by a caregiver as part of a regular health exam every 1 to 3 years. After age 40, women should have a CBE every year. Starting at age 40, women should consider having a mammography (breast X-ray test) every year. Women who have a family history of breast cancer should talk to their caregiver about genetic screening. Women at a high risk of breast cancer should talk to their caregivers about having magnetic resonance imaging (MRI) and a mammography every year.   The Pap test is a screening test for cervical cancer. A Pap test can show cell changes on the cervix that might become cervical cancer if left untreated. A Pap test is a procedure in which cells are obtained and examined from the lower end of the uterus (cervix).   Women should have a Pap test starting at age 21.   Between ages 21 and 29, Pap tests should be repeated every 2 years.   Beginning at age 30, you should have a Pap test every 3 years as long as the past 3 Pap tests have been normal.   Some women have medical problems that increase the chance of getting cervical cancer. Talk to your caregiver about these problems. It is especially important to talk to your caregiver if a new problem develops soon after your last Pap test. In these cases, your caregiver may recommend more frequent screening and Pap tests.   The above recommendations are the same for women who have or have not gotten the vaccine for human papillomavirus (HPV).   If you had a hysterectomy for a problem that was not cancer or a condition that could lead to cancer, then you no longer need Pap tests. Even if you no longer need a Pap test, a regular exam is a good idea to make sure no other problems are  starting.   If you are between ages 65 and 70, and you have had normal Pap tests going back 10 years, you no longer need Pap tests. Even if you no longer need a Pap test, a regular exam is a good idea to make sure no other problems are starting.   If you have had past treatment for cervical cancer or a condition that could lead to cancer, you need Pap tests and screening for cancer for at least 20 years after your treatment.   If Pap tests have been discontinued, risk factors (such as a new sexual partner) need to be reassessed to determine if screening should be resumed.   The HPV test is an additional test that may be used for cervical cancer screening. The HPV test looks for the virus that can cause the cell changes on the cervix.   The cells collected during the Pap test can be tested for HPV. The HPV test could be used to screen women aged 30 years and older, and should be used in women of any age who have unclear Pap test results. After the age of 30, women should have HPV testing at the same frequency as a Pap test.   Colorectal cancer can be detected and often prevented. Most routine colorectal cancer screening begins at the age of 50 and continues through age 75. However, your caregiver may recommend screening at an earlier age if you have risk factors for colon cancer. On a yearly basis, your caregiver may provide home test kits to check for hidden blood in the stool. Use of a small camera at the end of a tube, to directly examine the colon (sigmoidoscopy or colonoscopy), can detect the earliest forms of colorectal cancer. Talk to your caregiver about this at age 50, when routine screening begins. Direct examination of the colon should be repeated every 5 to 10 years through age 75, unless early forms of pre-cancerous polyps or small growths are found.   Hepatitis C blood testing is recommended for all people born from 1945 through 1965 and any individual with known risks for hepatitis C.    Practice safe sex. Use condoms and avoid high-risk sexual practices to reduce the spread of sexually transmitted infections (STIs). STIs include gonorrhea, chlamydia, syphilis, trichomonas, herpes, HPV, and human immunodeficiency virus (HIV). Herpes, HIV, and HPV are viral illnesses that have no cure. They can result in disability, cancer, and death. Sexually active women aged 25 and younger should be checked for chlamydia. Older women with new or multiple partners should also be tested for chlamydia. Testing for other STIs is recommended if you are sexually active and at increased risk.   Osteoporosis is a disease in which the bones lose minerals and strength with aging. This can result in serious bone fractures. The risk of osteoporosis can be identified using a bone density scan. Women ages 65 and over and women at risk for fractures or osteoporosis should discuss screening with their caregivers. Ask your caregiver whether you should take a calcium supplement or vitamin D to reduce the rate of osteoporosis.   Menopause can be associated with physical symptoms and risks. Hormone replacement therapy is available to decrease symptoms and risks. You should talk to your caregiver about whether hormone replacement therapy is right for you.   Use sunscreen with sun protection factor (SPF) of 30 or more. Apply sunscreen liberally and repeatedly throughout the day. You should seek shade when your shadow is shorter than you. Protect yourself by wearing long sleeves, pants, a wide-brimmed hat, and sunglasses year round, whenever you are outdoors.   Once a month, do a whole body skin exam, using a mirror to look at the skin on your back. Notify your caregiver of new moles, moles that have irregular borders, moles that are larger than a pencil eraser, or moles that have changed in shape or color.   Stay current with required immunizations.   Influenza. You need a dose every fall (or winter). The composition of  the flu vaccine changes each year, so being vaccinated once is not enough.   Pneumococcal polysaccharide. You need 1 to 2 doses if you smoke cigarettes or if you have certain chronic medical conditions. You need 1 dose at age 65 (or older) if you have never been vaccinated.   Tetanus, diphtheria, pertussis (Tdap, Td). Get 1 dose of   Tdap vaccine if you are younger than age 65, are over 65 and have contact with an infant, are a healthcare worker, are pregnant, or simply want to be protected from whooping cough. After that, you need a Td booster dose every 10 years. Consult your caregiver if you have not had at least 3 tetanus and diphtheria-containing shots sometime in your life or have a deep or dirty wound.   HPV. You need this vaccine if you are a woman age 26 or younger. The vaccine is given in 3 doses over 6 months.   Measles, mumps, rubella (MMR). You need at least 1 dose of MMR if you were born in 1957 or later. You may also need a second dose.   Meningococcal. If you are age 19 to 21 and a first-year college student living in a residence hall, or have one of several medical conditions, you need to get vaccinated against meningococcal disease. You may also need additional booster doses.   Zoster (shingles). If you are age 60 or older, you should get this vaccine.   Varicella (chickenpox). If you have never had chickenpox or you were vaccinated but received only 1 dose, talk to your caregiver to find out if you need this vaccine.   Hepatitis A. You need this vaccine if you have a specific risk factor for hepatitis A virus infection or you simply wish to be protected from this disease. The vaccine is usually given as 2 doses, 6 to 18 months apart.   Hepatitis B. You need this vaccine if you have a specific risk factor for hepatitis B virus infection or you simply wish to be protected from this disease. The vaccine is given in 3 doses, usually over 6 months.  Preventive Services /  Frequency Ages 19 to 39  Blood pressure check.** / Every 1 to 2 years.   Lipid and cholesterol check.** / Every 5 years beginning at age 20.   Clinical breast exam.** / Every 3 years for women in their 20s and 30s.   Pap test.** / Every 2 years from ages 21 through 29. Every 3 years starting at age 30 through age 65 or 70 with a history of 3 consecutive normal Pap tests.   HPV screening.** / Every 3 years from ages 30 through ages 65 to 70 with a history of 3 consecutive normal Pap tests.   Hepatitis C blood test.** / For any individual with known risks for hepatitis C.   Skin self-exam. / Monthly.   Influenza immunization.** / Every year.   Pneumococcal polysaccharide immunization.** / 1 to 2 doses if you smoke cigarettes or if you have certain chronic medical conditions.   Tetanus, diphtheria, pertussis (Tdap, Td) immunization. / A one-time dose of Tdap vaccine. After that, you need a Td booster dose every 10 years.   HPV immunization. / 3 doses over 6 months, if you are 26 and younger.   Measles, mumps, rubella (MMR) immunization. / You need at least 1 dose of MMR if you were born in 1957 or later. You may also need a second dose.   Meningococcal immunization. / 1 dose if you are age 19 to 21 and a first-year college student living in a residence hall, or have one of several medical conditions, you need to get vaccinated against meningococcal disease. You may also need additional booster doses.   Varicella immunization.** / Consult your caregiver.   Hepatitis A immunization.** / Consult your caregiver. 2 doses, 6 to 18 months   apart.   Hepatitis B immunization.** / Consult your caregiver. 3 doses usually over 6 months.  Ages 40 to 64  Blood pressure check.** / Every 1 to 2 years.   Lipid and cholesterol check.** / Every 5 years beginning at age 20.   Clinical breast exam.** / Every year after age 40.   Mammogram.** / Every year beginning at age 40 and continuing for as  long as you are in good health. Consult with your caregiver.   Pap test.** / Every 3 years starting at age 30 through age 65 or 70 with a history of 3 consecutive normal Pap tests.   HPV screening.** / Every 3 years from ages 30 through ages 65 to 70 with a history of 3 consecutive normal Pap tests.   Fecal occult blood test (FOBT) of stool. / Every year beginning at age 50 and continuing until age 75. You may not need to do this test if you get a colonoscopy every 10 years.   Flexible sigmoidoscopy or colonoscopy.** / Every 5 years for a flexible sigmoidoscopy or every 10 years for a colonoscopy beginning at age 50 and continuing until age 75.   Hepatitis C blood test.** / For all people born from 1945 through 1965 and any individual with known risks for hepatitis C.   Skin self-exam. / Monthly.   Influenza immunization.** / Every year.   Pneumococcal polysaccharide immunization.** / 1 to 2 doses if you smoke cigarettes or if you have certain chronic medical conditions.   Tetanus, diphtheria, pertussis (Tdap, Td) immunization.** / A one-time dose of Tdap vaccine. After that, you need a Td booster dose every 10 years.   Measles, mumps, rubella (MMR) immunization. / You need at least 1 dose of MMR if you were born in 1957 or later. You may also need a second dose.   Varicella immunization.** / Consult your caregiver.   Meningococcal immunization.** / Consult your caregiver.   Hepatitis A immunization.** / Consult your caregiver. 2 doses, 6 to 18 months apart.   Hepatitis B immunization.** / Consult your caregiver. 3 doses, usually over 6 months.  Ages 65 and over  Blood pressure check.** / Every 1 to 2 years.   Lipid and cholesterol check.** / Every 5 years beginning at age 20.   Clinical breast exam.** / Every year after age 40.   Mammogram.** / Every year beginning at age 40 and continuing for as long as you are in good health. Consult with your caregiver.   Pap test.** /  Every 3 years starting at age 30 through age 65 or 70 with a 3 consecutive normal Pap tests. Testing can be stopped between 65 and 70 with 3 consecutive normal Pap tests and no abnormal Pap or HPV tests in the past 10 years.   HPV screening.** / Every 3 years from ages 30 through ages 65 or 70 with a history of 3 consecutive normal Pap tests. Testing can be stopped between 65 and 70 with 3 consecutive normal Pap tests and no abnormal Pap or HPV tests in the past 10 years.   Fecal occult blood test (FOBT) of stool. / Every year beginning at age 50 and continuing until age 75. You may not need to do this test if you get a colonoscopy every 10 years.   Flexible sigmoidoscopy or colonoscopy.** / Every 5 years for a flexible sigmoidoscopy or every 10 years for a colonoscopy beginning at age 50 and continuing until age 75.   Hepatitis   C blood test.** / For all people born from 1945 through 1965 and any individual with known risks for hepatitis C.   Osteoporosis screening.** / A one-time screening for women ages 65 and over and women at risk for fractures or osteoporosis.   Skin self-exam. / Monthly.   Influenza immunization.** / Every year.   Pneumococcal polysaccharide immunization.** / 1 dose at age 65 (or older) if you have never been vaccinated.   Tetanus, diphtheria, pertussis (Tdap, Td) immunization. / A one-time dose of Tdap vaccine if you are over 65 and have contact with an infant, are a healthcare worker, or simply want to be protected from whooping cough. After that, you need a Td booster dose every 10 years.   Varicella immunization.** / Consult your caregiver.   Meningococcal immunization.** / Consult your caregiver.   Hepatitis A immunization.** / Consult your caregiver. 2 doses, 6 to 18 months apart.   Hepatitis B immunization.** / Check with your caregiver. 3 doses, usually over 6 months.  ** Family history and personal history of risk and conditions may change your caregiver's  recommendations. Document Released: 06/06/2001 Document Revised: 03/30/2011 Document Reviewed: 09/05/2010 [employer] Patient Information 2012 [employer]. 

## 2011-10-10 NOTE — Progress Notes (Signed)
Subjective:     Laura Price is a 61 y.o. female and is here for a comprehensive physical exam. The patient reports no problems.  History   Social History  . Marital Status: Married    Spouse Name: N/A    Number of Children: N/A  . Years of Education: N/A   Occupational History  . Not on file.   Social History Main Topics  . Smoking status: Never Smoker   . Smokeless tobacco: Never Used  . Alcohol Use: Yes     Occ  . Drug Use: No  . Sexually Active: Yes -- Female partner(s)   Other Topics Concern  . Not on file   Social History Narrative   Exercise-- walk    Health Maintenance  Topic Date Due  . Zostavax  10/07/2011  . Influenza Vaccine  01/23/2012  . Mammogram  10/19/2012  . Pap Smear  10/09/2013  . Colonoscopy  02/25/2015  . Tetanus/tdap  11/21/2017    The following portions of the patient's history were reviewed and updated as appropriate: allergies, current medications, past family history, past medical history, past social history, past surgical history and problem list.  Review of Systems Review of Systems  Constitutional: Negative for activity change, appetite change and fatigue.  HENT: Negative for hearing loss, congestion, tinnitus and ear discharge.  dentist q25m Eyes: Negative for visual disturbance (see optho q2y -- vision corrected to 20/20 with glasses).  Respiratory: Negative for cough, chest tightness and shortness of breath.   Cardiovascular: Negative for chest pain, palpitations and leg swelling.  Gastrointestinal: Negative for abdominal pain, diarrhea, constipation and abdominal distention.  Genitourinary: Negative for urgency, frequency, decreased urine volume and difficulty urinating.  Musculoskeletal: Negative for back pain, arthralgias and gait problem.  Skin: Negative for color change, pallor and rash.  Neurological: Negative for dizziness, light-headedness, numbness and headaches.  Hematological: Negative for adenopathy. Does not  bruise/bleed easily.  Psychiatric/Behavioral: Negative for suicidal ideas, confusion, sleep disturbance, self-injury, dysphoric mood, decreased concentration and agitation.       Objective:    BP 116/72  Pulse 59  Temp 98.1 F (36.7 C) (Oral)  Ht 5' 6.25" (1.683 m)  Wt 154 lb 6.4 oz (70.035 kg)  BMI 24.73 kg/m2  SpO2 97% General appearance: alert, cooperative, appears stated age and no distress Head: Normocephalic, without obvious abnormality, atraumatic Eyes: conjunctivae/corneas clear. PERRL, EOM's intact. Fundi benign. Ears: normal TM's and external ear canals both ears Nose: Nares normal. Septum midline. Mucosa normal. No drainage or sinus tenderness. Throat: lips, mucosa, and tongue normal; teeth and gums normal Neck: no adenopathy, no carotid bruit, no JVD, supple, symmetrical, trachea midline and thyroid not enlarged, symmetric, no tenderness/mass/nodules Back: symmetric, no curvature. ROM normal. No CVA tenderness. Lungs: clear to auscultation bilaterally Breasts: gyn Heart: regular rate and rhythm, S1, S2 normal, no murmur, click, rub or gallop Abdomen: soft, non-tender; bowel sounds normal; no masses,  no organomegaly Pelvic: gyn Extremities: varicose veins noted-- L > R Pulses: 2+ and symmetric Skin: Skin color, texture, turgor normal. No rashes or lesions Lymph nodes: Cervical, supraclavicular, and axillary nodes normal. Neurologic: Alert and oriented X 3, normal strength and tone. Normal symmetric reflexes. Normal coordination and gait psych-- no depression, no anxiety    Assessment:    Healthy female exam.     varicose veins-- rx for compression stockings--- will refer to vascular surgeon in fall ( pt request) Stress incontinence--  Pt is doing kegal exercises.  She does not want  meds at this time--she will f/u with gyn if it becomes worse Plan:    check labs ghm utd See After Visit Summary for Counseling Recommendations

## 2011-10-25 ENCOUNTER — Ambulatory Visit
Admission: RE | Admit: 2011-10-25 | Discharge: 2011-10-25 | Disposition: A | Discharge status: Home / Self Care | Payer: BC Managed Care – PPO | Source: Ambulatory Visit | Attending: Obstetrics and Gynecology | Admitting: Obstetrics and Gynecology

## 2011-10-25 DIAGNOSIS — Z1231 Encounter for screening mammogram for malignant neoplasm of breast: Secondary | ICD-10-CM

## 2012-02-16 ENCOUNTER — Encounter: Payer: Self-pay | Admitting: Gastroenterology

## 2012-06-13 ENCOUNTER — Ambulatory Visit (INDEPENDENT_AMBULATORY_CARE_PROVIDER_SITE_OTHER): Payer: BC Managed Care – PPO | Admitting: Family Medicine

## 2012-06-13 ENCOUNTER — Encounter: Payer: Self-pay | Admitting: Family Medicine

## 2012-06-13 VITALS — BP 110/74 | HR 69 | Temp 97.7°F | Wt 158.4 lb

## 2012-06-13 DIAGNOSIS — Z8619 Personal history of other infectious and parasitic diseases: Secondary | ICD-10-CM | POA: Insufficient documentation

## 2012-06-13 DIAGNOSIS — B029 Zoster without complications: Secondary | ICD-10-CM

## 2012-06-13 MED ORDER — VALACYCLOVIR HCL 1 G PO TABS: 1000.0000 mg | ORAL_TABLET | Freq: Three times a day (TID) | ORAL | Status: DC

## 2012-06-13 MED ORDER — HYDROCODONE-ACETAMINOPHEN 5-325 MG PO TABS: 1.0000 | ORAL_TABLET | Freq: Four times a day (QID) | ORAL | Status: DC | PRN

## 2012-06-13 NOTE — Patient Instructions (Signed)

## 2012-06-14 NOTE — Progress Notes (Signed)
  Subjective:     Laura Price is a 62 y.o. female who presents for evaluation of a rash involving the face. Rash started 3 days ago. Lesions are pink, and raised in texture. Rash has changed over time. Rash is painful. Associated symptoms: none. Patient denies: abdominal pain, arthralgia, congestion, cough, crankiness, decrease in appetite, decrease in energy level, fever, headache, irritability, myalgia, nausea, sore throat and vomiting. Patient has not had contacts with similar rash. Patient has not had new exposures (soaps, lotions, laundry detergents, foods, medications, plants, insects or animals).  The following portions of the patient's history were reviewed and updated as appropriate: allergies, current medications, past family history, past medical history, past social history, past surgical history and problem list.  Review of Systems Pertinent items are noted in HPI.    Objective:    BP 110/74  Pulse 69  Temp(Src) 97.7 F (36.5 C) (Oral)  Wt 158 lb 6.4 oz (71.85 kg)  BMI 25.37 kg/m2  SpO2 96% General:  alert, cooperative, appears stated age and no distress  Skin:  papules noted on face--only on R side of cheek , in cluster     Assessment:    shingles    Plan:    Medications: valtrex. Written patient instruction given. Follow up in a few days. --prn

## 2012-08-14 ENCOUNTER — Encounter: Payer: Self-pay | Admitting: Gastroenterology

## 2012-08-15 ENCOUNTER — Other Ambulatory Visit: Payer: Self-pay | Admitting: Family Medicine

## 2012-08-15 NOTE — Telephone Encounter (Signed)
Rx sent in to pharmacy. 

## 2012-08-20 ENCOUNTER — Telehealth: Payer: Self-pay | Admitting: Family Medicine

## 2012-08-20 NOTE — Telephone Encounter (Signed)
Pt needs appointment

## 2012-08-20 NOTE — Telephone Encounter (Signed)
Patient Information:  Caller Name: Marsela  Phone: [phone]  Patient: Laura Price, Laura Price  Gender: Female  DOB: 11-13-1950  Age: 62 Years  PCP: Lelon Perla.  Office Follow Up:  Does the office need to follow up with this patient?: Yes  Instructions For The Office: PLEASE CONTACT PATIENT FOR APPT.  OFFICE BOOKED FOR TODAY.  RN Note:  Patient is Age >32 with fever 100.9 last night.  No appt available. can't patient be worked in?  Please contact  Symptoms  Reason For Call & Symptoms: Patient was seen at [facility] on 08/09/12 and diagnosed with Bronichitis, Sinusitis and Laryngiitis.  She was given Augmentin, Cough medication with Codiene, Tessalon Pearls and Dilucan if needed.  Worsening on Sunday, 08/18/12- cough worsening dry, Fever 100.0 (o), runny nose clear,  Diarrhea (since Augmentin) >5 daily and liquid.  Reviewed Health History In EMR: Yes  Reviewed Medications In EMR: Yes  Reviewed Allergies In EMR: Yes  Reviewed Surgeries / Procedures: Yes  Date of Onset of Symptoms: 08/09/2012  Treatments Tried: Diflucan for yeast infection. Diarrhea-probiotic  Treatments Tried Worked: No  Any Fever: Yes  Fever Taken: Oral  Fever Time Of Reading: 08:00:00  Fever Last Reading: 99.8  Guideline(s) Used:  Cough  Disposition Per Guideline:   See Today or Tomorrow in Office  Reason For Disposition Reached:   Continuous (nonstop) coughing interferes with work or school and no improvement using cough treatment per Care Advice  Advice Given:  Reassurance  Coughing is the way that our lungs remove irritants and mucus. It helps protect our lungs from getting pneumonia.  You can get a dry hacking cough after a chest cold. Sometimes this type of cough can last 1-3 weeks, and be worse at night.  Cough Medicines:  OTC Cough Drops: Cough drops can help a lot, especially for mild coughs. They reduce coughing by soothing your irritated throat and removing that tickle sensation in the  back of the throat. Cough drops also have the advantage of portability - you can carry them with you.  Home Remedy - Honey: This old home remedy has been shown to help decrease coughing at night. The adult dosage is 2 teaspoons (10 ml) at bedtime. Honey should not be given to infants under one year of age.  Coughing Spasms:  Drink warm fluids. Inhale warm mist (Reason: both relax the airway and loosen up the phlegm).  Suck on cough drops or hard candy to coat the irritated throat.  Prevent Dehydration:  Drink adequate liquids.  This will help soothe an irritated or dry throat and loosen up the phlegm.  Call Back If:  Difficulty breathing  You become worse.  Patient Will Follow Care Advice:  YES

## 2012-08-20 NOTE — Telephone Encounter (Signed)
Pt scheduled for 08/21/12 @ 11:15am.

## 2012-08-21 ENCOUNTER — Ambulatory Visit (INDEPENDENT_AMBULATORY_CARE_PROVIDER_SITE_OTHER): Payer: BC Managed Care – PPO | Admitting: Family Medicine

## 2012-08-21 ENCOUNTER — Encounter: Payer: Self-pay | Admitting: Family Medicine

## 2012-08-21 ENCOUNTER — Ambulatory Visit (HOSPITAL_BASED_OUTPATIENT_CLINIC_OR_DEPARTMENT_OTHER)
Admission: RE | Admit: 2012-08-21 | Discharge: 2012-08-21 | Disposition: A | Discharge status: Home / Self Care | Payer: BC Managed Care – PPO | Source: Ambulatory Visit | Attending: Family Medicine | Admitting: Family Medicine

## 2012-08-21 VITALS — BP 112/74 | HR 75 | Temp 98.2°F | Wt 153.2 lb

## 2012-08-21 DIAGNOSIS — R197 Diarrhea, unspecified: Secondary | ICD-10-CM

## 2012-08-21 DIAGNOSIS — L259 Unspecified contact dermatitis, unspecified cause: Secondary | ICD-10-CM

## 2012-08-21 DIAGNOSIS — R05 Cough: Secondary | ICD-10-CM

## 2012-08-21 DIAGNOSIS — R059 Cough, unspecified: Secondary | ICD-10-CM | POA: Insufficient documentation

## 2012-08-21 DIAGNOSIS — R0989 Other specified symptoms and signs involving the circulatory and respiratory systems: Secondary | ICD-10-CM | POA: Insufficient documentation

## 2012-08-21 LAB — CBC WITH DIFFERENTIAL/PLATELET
Basophils Relative: 0.9 % (ref 0.0–3.0)
Eosinophils Absolute: 0.1 10*3/uL (ref 0.0–0.7)
HCT: 41.7 % (ref 36.0–46.0)
Lymphs Abs: 1.3 10*3/uL (ref 0.7–4.0)
MCHC: 34.1 g/dL (ref 30.0–36.0)
MCV: 87 fl (ref 78.0–100.0)
Monocytes Absolute: 0.7 10*3/uL (ref 0.1–1.0)
Neutrophils Relative %: 67.2 % (ref 43.0–77.0)
Platelets: 255 10*3/uL (ref 150.0–400.0)
RBC: 4.79 Mil/uL (ref 3.87–5.11)

## 2012-08-21 LAB — BASIC METABOLIC PANEL
BUN: 6 mg/dL (ref 6–23)
CO2: 28 mEq/L (ref 19–32)
Chloride: 101 mEq/L (ref 96–112)
Creatinine, Ser: 0.5 mg/dL (ref 0.4–1.2)

## 2012-08-21 LAB — HEPATIC FUNCTION PANEL
Bilirubin, Direct: 0 mg/dL (ref 0.0–0.3)
Total Protein: 6.8 g/dL (ref 6.0–8.3)

## 2012-08-21 MED ORDER — METHYLPREDNISOLONE ACETATE 80 MG/ML IJ SUSP
80.0000 mg | Freq: Once | INTRAMUSCULAR | Status: AC
  Administered 2012-08-21: 80 mg via INTRAMUSCULAR

## 2012-08-21 MED ORDER — PREDNISONE 10 MG PO TABS: ORAL_TABLET | ORAL | Status: DC

## 2012-08-21 MED ORDER — AZITHROMYCIN 250 MG PO TABS: ORAL_TABLET | ORAL | Status: DC

## 2012-08-21 NOTE — Patient Instructions (Signed)

## 2012-08-22 ENCOUNTER — Telehealth: Payer: Self-pay | Admitting: Gastroenterology

## 2012-08-22 ENCOUNTER — Encounter: Payer: Self-pay | Admitting: Family Medicine

## 2012-08-22 NOTE — Progress Notes (Signed)
  Subjective:     Laura Price is a 62 y.o. female who presents for evaluation of a rash involving the forearm and upper extremity. Rash started a few days ago. Lesions are pink, and blistering in texture. Rash has changed over time. Rash is pruritic. Associated symptoms: none. Patient denies: abdominal pain, arthralgia, congestion, cough, crankiness, decrease in appetite, decrease in energy level, fever, headache, irritability, myalgia, nausea, sore throat and vomiting. Patient has not had contacts with similar rash. Patient has had new exposures (soaps, lotions, laundry detergents, foods, medications, plants, insects or animals). + was working in yard and has poison oak.    The following portions of the patient's history were reviewed and updated as appropriate: allergies, current medications, past family history, past medical history, past social history, past surgical history and problem list.  Review of Systems Pertinent items are noted in HPI.    Objective:    BP 112/74  Pulse 75  Temp(Src) 98.2 F (36.8 C) (Oral)  Wt 153 lb 3.2 oz (69.491 kg)  BMI 24.53 kg/m2  SpO2 97% General:  alert, cooperative, appears stated age and no distress  Skin:  vesicles noted on extremities     Assessment:    contact dermatitis: plants poison oak    Plan:    Medications: benadryl and steroids: taper. Written patient instruction given. Follow up in several days.

## 2012-08-22 NOTE — Telephone Encounter (Signed)
Pt thought she was due for colon in 10years. Per report pt was supposed to have a repeat colon in Nov of 2013. Pt states she will call back later to schedule the colon.

## 2012-10-09 ENCOUNTER — Encounter: Payer: Self-pay | Admitting: Gastroenterology

## 2012-10-09 ENCOUNTER — Other Ambulatory Visit: Payer: Self-pay

## 2012-10-09 DIAGNOSIS — Z1231 Encounter for screening mammogram for malignant neoplasm of breast: Secondary | ICD-10-CM

## 2012-10-10 ENCOUNTER — Other Ambulatory Visit: Payer: Self-pay | Admitting: Obstetrics and Gynecology

## 2012-10-10 DIAGNOSIS — Z780 Asymptomatic menopausal state: Secondary | ICD-10-CM

## 2012-10-18 ENCOUNTER — Ambulatory Visit (AMBULATORY_SURGERY_CENTER): Payer: BC Managed Care – PPO | Admitting: *Deleted

## 2012-10-18 ENCOUNTER — Encounter: Payer: Self-pay | Admitting: Gastroenterology

## 2012-10-18 VITALS — Ht 66.0 in | Wt 152.0 lb

## 2012-10-18 DIAGNOSIS — Z1211 Encounter for screening for malignant neoplasm of colon: Secondary | ICD-10-CM

## 2012-10-18 DIAGNOSIS — Z86010 Personal history of colonic polyps: Secondary | ICD-10-CM

## 2012-10-18 MED ORDER — NA SULFATE-K SULFATE-MG SULF 17.5-3.13-1.6 GM/177ML PO SOLN: 1.0000 | ORAL | Status: DC

## 2012-10-31 ENCOUNTER — Ambulatory Visit (AMBULATORY_SURGERY_CENTER): Payer: BC Managed Care – PPO | Admitting: Gastroenterology

## 2012-10-31 ENCOUNTER — Encounter: Payer: Self-pay | Admitting: Gastroenterology

## 2012-10-31 VITALS — BP 138/78 | HR 65 | Temp 97.7°F | Resp 43 | Ht 66.0 in | Wt 152.0 lb

## 2012-10-31 DIAGNOSIS — Z86010 Personal history of colonic polyps: Secondary | ICD-10-CM

## 2012-10-31 MED ORDER — SODIUM CHLORIDE 0.9 % IV SOLN: 500.0000 mL | INTRAVENOUS | Status: DC

## 2012-10-31 NOTE — Progress Notes (Signed)
Stable to RR 

## 2012-10-31 NOTE — Op Note (Signed)
[facility] [address] [place], [address]   COLONOSCOPY PROCEDURE REPORT  PATIENT: Laura Price, Laura Price  MR#: [id number] BIRTHDATE: 14-Nov-1950 , 62  yrs. old GENDER: Female ENDOSCOPIST: Louis Meckel, MD REFERRED BY: PROCEDURE DATE:  10/31/2012 PROCEDURE:   Colonoscopy, diagnostic ASA CLASS:   Class I INDICATIONS:Patient's personal history of adenomatous colon polyps. adenomatous polyp 2003. Normal colonoscopy 2006 MEDICATIONS: MAC sedation, administered by CRNA and propofol (Diprivan) 400mg  IV  DESCRIPTION OF PROCEDURE:   After the risks benefits and alternatives of the procedure were thoroughly explained, informed consent was obtained.  A digital rectal exam revealed no abnormalities of the rectum.   The LB [id number]  endoscope was introduced through the anus and advanced to the cecum, which was identified by both the appendix and ileocecal valve. No adverse events experienced.   The quality of the prep was Suprep good  The instrument was then slowly withdrawn as the colon was fully examined.      COLON FINDINGS: A single 2-3 mm AVM was noted in the proximal transverse colon.  the remainder of the exam was normal. Retroflexed views revealed no abnormalities. The time to cecum=5 minutes 22 seconds.  Withdrawal time=8 minutes 57 seconds.  The scope was withdrawn and the procedure completed. COMPLICATIONS: There were no complications.  ENDOSCOPIC IMPRESSION: colonic AVM  RECOMMENDATIONS: Continue current colorectal screening recommendations for "routine risk" patients with a repeat colonoscopy in 10 years.   eSigned:  Louis Meckel, MD 10/31/2012 3:29 PM   cc: Judy Pimple, MD and Zelphia Cairo MD

## 2012-10-31 NOTE — Patient Instructions (Addendum)
Discharge instructions given with verbal understanding. Normal exam. Resume previous medications. YOU HAD AN ENDOSCOPIC PROCEDURE TODAY AT THE [facility]: Refer to the procedure report that was given to you for any specific questions about what was found during the examination.  If the procedure report does not answer your questions, please call your gastroenterologist to clarify.  If you requested that your care partner not be given the details of your procedure findings, then the procedure report has been included in a sealed envelope for you to review at your convenience later.  YOU SHOULD EXPECT: Some feelings of bloating in the abdomen. Passage of more gas than usual.  Walking can help get rid of the air that was put into your GI tract during the procedure and reduce the bloating. If you had a lower endoscopy (such as a colonoscopy or flexible sigmoidoscopy) you may notice spotting of blood in your stool or on the toilet paper. If you underwent a bowel prep for your procedure, then you may not have a normal bowel movement for a few days.  DIET: Your first meal following the procedure should be a light meal and then it is ok to progress to your normal diet.  A half-sandwich or bowl of soup is an example of a good first meal.  Heavy or fried foods are harder to digest and may make you feel nauseous or bloated.  Likewise meals heavy in dairy and vegetables can cause extra gas to form and this can also increase the bloating.  Drink plenty of fluids but you should avoid alcoholic beverages for 24 hours.  ACTIVITY: Your care partner should take you home directly after the procedure.  You should plan to take it easy, moving slowly for the rest of the day.  You can resume normal activity the day after the procedure however you should NOT DRIVE or use heavy machinery for 24 hours (because of the sedation medicines used during the test).    SYMPTOMS TO REPORT IMMEDIATELY: A gastroenterologist  can be reached at any hour.  During normal business hours, 8:30 AM to 5:00 PM Monday through Friday, call [phone].  After hours and on weekends, please call the GI answering service at [phone] who will take a message and have the physician on call contact you.   Following lower endoscopy (colonoscopy or flexible sigmoidoscopy):  Excessive amounts of blood in the stool  Significant tenderness or worsening of abdominal pains  Swelling of the abdomen that is new, acute  Fever of 100F or higher  FOLLOW UP: If any biopsies were taken you will be contacted by phone or by letter within the next 1-3 weeks.  Call your gastroenterologist if you have not heard about the biopsies in 3 weeks.  Our staff will call the home number listed on your records the next business day following your procedure to check on you and address any questions or concerns that you may have at that time regarding the information given to you following your procedure. This is a courtesy call and so if there is no answer at the home number and we have not heard from you through the emergency physician on call, we will assume that you have returned to your regular daily activities without incident.  SIGNATURES/CONFIDENTIALITY: You and/or your care partner have signed paperwork which will be entered into your electronic medical record.  These signatures attest to the fact that that the information above on your After Visit Summary has been reviewed   and is understood.  Full responsibility of the confidentiality of this discharge information lies with you and/or your care-partner. 

## 2012-10-31 NOTE — Progress Notes (Signed)
Patient did not experience any of the following events: a burn prior to discharge; a fall within the facility; wrong site/side/patient/procedure/implant event; or a hospital transfer or hospital admission upon discharge from the facility. (G8907) Patient did not have preoperative order for IV antibiotic SSI prophylaxis. (G8918)  

## 2012-11-01 ENCOUNTER — Telehealth: Payer: Self-pay | Admitting: *Deleted

## 2012-11-01 NOTE — Telephone Encounter (Signed)
  Follow up Call-  Call back number 10/31/2012  Post procedure Call Back phone  # [phone]     Patient questions:  Do you have a fever, pain , or abdominal swelling? no Pain Score  0 *  Have you tolerated food without any problems? yes  Have you been able to return to your normal activities? yes  Do you have any questions about your discharge instructions: Diet   no Medications  no Follow up visit  no  Do you have questions or concerns about your Care? no  Actions: * If pain score is 4 or above: No action needed, pain <4.

## 2012-11-05 ENCOUNTER — Ambulatory Visit
Admission: RE | Admit: 2012-11-05 | Discharge: 2012-11-05 | Disposition: A | Discharge status: Home / Self Care | Payer: BC Managed Care – PPO | Source: Ambulatory Visit

## 2012-11-05 ENCOUNTER — Ambulatory Visit
Admission: RE | Admit: 2012-11-05 | Discharge: 2012-11-05 | Disposition: A | Discharge status: Home / Self Care | Payer: BC Managed Care – PPO | Source: Ambulatory Visit | Attending: Obstetrics and Gynecology | Admitting: Obstetrics and Gynecology

## 2012-11-05 DIAGNOSIS — Z1231 Encounter for screening mammogram for malignant neoplasm of breast: Secondary | ICD-10-CM

## 2012-11-05 DIAGNOSIS — Z780 Asymptomatic menopausal state: Secondary | ICD-10-CM

## 2012-11-05 LAB — HM DEXA SCAN

## 2012-12-05 ENCOUNTER — Ambulatory Visit (INDEPENDENT_AMBULATORY_CARE_PROVIDER_SITE_OTHER): Payer: BC Managed Care – PPO | Admitting: Family Medicine

## 2012-12-05 ENCOUNTER — Encounter: Payer: Self-pay | Admitting: Family Medicine

## 2012-12-05 VITALS — BP 114/70 | HR 58 | Temp 98.1°F | Ht 66.0 in | Wt 151.8 lb

## 2012-12-05 DIAGNOSIS — J309 Allergic rhinitis, unspecified: Secondary | ICD-10-CM

## 2012-12-05 DIAGNOSIS — Z0000 Encounter for general adult medical examination without abnormal findings: Secondary | ICD-10-CM

## 2012-12-05 DIAGNOSIS — Z8619 Personal history of other infectious and parasitic diseases: Secondary | ICD-10-CM

## 2012-12-05 DIAGNOSIS — J302 Other seasonal allergic rhinitis: Secondary | ICD-10-CM

## 2012-12-05 DIAGNOSIS — I83893 Varicose veins of bilateral lower extremities with other complications: Secondary | ICD-10-CM

## 2012-12-05 LAB — CBC WITH DIFFERENTIAL/PLATELET
Basophils Relative: 0.7 % (ref 0.0–3.0)
Eosinophils Relative: 1.1 % (ref 0.0–5.0)
HCT: 40.3 % (ref 36.0–46.0)
Hemoglobin: 13.5 g/dL (ref 12.0–15.0)
Lymphocytes Relative: 23.6 % (ref 12.0–46.0)
Lymphs Abs: 1.2 10*3/uL (ref 0.7–4.0)
Monocytes Relative: 8.4 % (ref 3.0–12.0)
Neutro Abs: 3.4 10*3/uL (ref 1.4–7.7)
RBC: 4.53 Mil/uL (ref 3.87–5.11)
RDW: 14.3 % (ref 11.5–14.6)
WBC: 5.2 10*3/uL (ref 4.5–10.5)

## 2012-12-05 LAB — HEPATIC FUNCTION PANEL
AST: 15 U/L (ref 0–37)
Albumin: 3.8 g/dL (ref 3.5–5.2)

## 2012-12-05 LAB — BASIC METABOLIC PANEL
GFR: 119.37 mL/min (ref >60.00)
Glucose, Bld: 90 mg/dL (ref 70–99)
Potassium: 3.6 mEq/L (ref 3.5–5.1)
Sodium: 133 mEq/L — ABNORMAL LOW (ref 135–145)

## 2012-12-05 LAB — LIPID PANEL
Cholesterol: 158 mg/dL (ref 0–200)
VLDL: 7.8 mg/dL (ref 0.0–40.0)

## 2012-12-05 MED ORDER — FLUTICASONE PROPIONATE 50 MCG/ACT NA SUSP: 1.0000 | Freq: Two times a day (BID) | NASAL | Status: DC

## 2012-12-05 MED ORDER — NONFORMULARY OR COMPOUNDED ITEM: Status: DC

## 2012-12-05 NOTE — Assessment & Plan Note (Signed)
Pt will get zostavax March , April 2015

## 2012-12-05 NOTE — Progress Notes (Signed)
Subjective:     Laura Price is a 62 y.o. female and is here for a comprehensive physical exam. The patient reports no problems.  History   Social History  . Marital Status: Married    Spouse Name: N/A    Number of Children: N/A  . Years of Education: N/A   Occupational History  . Not on file.   Social History Main Topics  . Smoking status: Never Smoker   . Smokeless tobacco: Never Used  . Alcohol Use: 1.2 oz/week    2 Glasses of wine per week  . Drug Use: No  . Sexual Activity: Yes    Partners: Male   Other Topics Concern  . Not on file   Social History Narrative   Exercise-- walk    Health Maintenance  Topic Date Due  . Zostavax  10/07/2011  . Influenza Vaccine  12/23/2012  . Mammogram  11/06/2014  . Pap Smear  12/05/2015  . Tetanus/tdap  11/21/2017  . Colonoscopy  11/01/2022    The following portions of the patient's history were reviewed and updated as appropriate:  She  has a past medical history of Allergic rhinitis. She  does not have any pertinent problems on file. She  has past surgical history that includes Total abdominal hysterectomy w/ bilateral salpingoophorectomy; Breast lumpectomy; and Myringotomy. Her family history includes Arthritis in her brother; Atrial fibrillation in her mother; Breast cancer (age of onset: 53) in her mother; Cancer (age of onset: 91) in her father and mother; Heart disease in her mother; Hypertension in her father and mother; Osteoarthritis (age of onset: 62) in an other family member; Prostate cancer in her father; Thyroid disease in her brother, mother, and sister. There is no history of Colon cancer. She  reports that she has never smoked. She has never used smokeless tobacco. She reports that she drinks about 1.2 ounces of alcohol per week. She reports that she does not use illicit drugs. She has a current medication list which includes the following prescription(s): aspirin, calcium-vitamin d-vitamin k, fluticasone,  and vivelle-dot. Current Outpatient Prescriptions on File Prior to Visit  Medication Sig Dispense Refill  . aspirin 81 MG tablet Take 81 mg by mouth daily.      . Calcium-Vitamin D-Vitamin K (CALCIUM SOFT CHEWS) 500-100-40 MG-UNT-MCG CHEW Chew 1 each by mouth daily.      [name] VIVELLE-DOT 0.075 MG/24HR Place 1 patch onto the skin 2 (two) times a week.       No current facility-administered medications on file prior to visit.   She is allergic to sulfonamide derivatives..  Review of Systems Review of Systems  Constitutional: Negative for activity change, appetite change and fatigue.  HENT: Negative for hearing loss, congestion, tinnitus and ear discharge.  dentist q9m Eyes: Negative for visual disturbance (see optho q2y -- vision corrected to 20/20 with glasses).  Respiratory: Negative for cough, chest tightness and shortness of breath.   Cardiovascular: Negative for chest pain, palpitations and leg swelling.  Gastrointestinal: Negative for abdominal pain, diarrhea, constipation and abdominal distention.  Genitourinary: Negative for urgency, frequency, decreased urine volume and difficulty urinating.  Musculoskeletal: Negative for back pain, arthralgias and gait problem.  Skin: Negative for color change, pallor and rash.  Neurological: Negative for dizziness, light-headedness, numbness and headaches.  Hematological: Negative for adenopathy. Does not bruise/bleed easily.  Psychiatric/Behavioral: Negative for suicidal ideas, confusion, sleep disturbance, self-injury, dysphoric mood, decreased concentration and agitation.           BP  114/70  Pulse 58  Temp(Src) 98.1 F (36.7 C) (Oral)  Ht 5\' 6"  (1.676 m)  Wt 151 lb 12.8 oz (68.856 kg)  BMI 24.51 kg/m2  SpO2 98% General appearance: alert, cooperative, appears stated age and no distress Head: Normocephalic, without obvious abnormality, atraumatic Eyes: conjunctivae/corneas clear. PERRL, EOM&#39;s intact. Fundi benign. Ears: normal  TM's and external ear canals both ears Nose: Nares normal. Septum midline. Mucosa normal. No drainage or sinus tenderness. Throat: lips, mucosa, and tongue normal; teeth and gums normal Neck: no adenopathy, no carotid bruit, no JVD, supple, symmetrical, trachea midline and thyroid not enlarged, symmetric, no tenderness/mass/nodules Back: symmetric, no curvature. ROM normal. No CVA tenderness. Lungs: clear to auscultation bilaterally Breasts: gyn Heart: regular rate and rhythm, S1, S2 normal, no murmur, click, rub or gallop Abdomen: soft, non-tender; bowel sounds normal; no masses,  no organomegaly Pelvic: deferred--gyn Extremities: extremities normal, atraumatic, no cyanosis or edema Pulses: 2+ and symmetric Skin: Skin color, texture, turgor normal. No rashes or lesions Lymph nodes: Cervical, supraclavicular, and axillary nodes normal. Neurologic: Alert and oriented X 3, normal strength and tone. Normal symmetric reflexes. Normal coordination and gait Psych- no anxiety, no depression      Assessment:    Healthy female exam.      Plan:    ghm utd Check labs See After Visit Summary for Counseling Recommendations

## 2012-12-05 NOTE — Patient Instructions (Addendum)
Preventive Care for Adults, Female A healthy lifestyle and preventive care can promote health and wellness. Preventive health guidelines for women include the following key practices.  A routine yearly physical is a good way to check with your caregiver about your health and preventive screening. It is a chance to share any concerns and updates on your health, and to receive a thorough exam.  Visit your dentist for a routine exam and preventive care every 6 months. Brush your teeth twice a day and floss once a day. Good oral hygiene prevents tooth decay and gum disease.  The frequency of eye exams is based on your age, health, family medical history, use of contact lenses, and other factors. Follow your caregiver's recommendations for frequency of eye exams.  Eat a healthy diet. Foods like vegetables, fruits, whole grains, low-fat dairy products, and lean protein foods contain the nutrients you need without too many calories. Decrease your intake of foods high in solid fats, added sugars, and salt. Eat the right amount of calories for you.Get information about a proper diet from your caregiver, if necessary.  Regular physical exercise is one of the most important things you can do for your health. Most adults should get at least 150 minutes of moderate-intensity exercise (any activity that increases your heart rate and causes you to sweat) each week. In addition, most adults need muscle-strengthening exercises on 2 or more days a week.  Maintain a healthy weight. The body mass index (BMI) is a screening tool to identify possible weight problems. It provides an estimate of body fat based on height and weight. Your caregiver can help determine your BMI, and can help you achieve or maintain a healthy weight.For adults 20 years and older:  A BMI below 18.5 is considered underweight.  A BMI of 18.5 to 24.9 is normal.  A BMI of 25 to 29.9 is considered overweight.  A BMI of 30 and above is  considered obese.  Maintain normal blood lipids and cholesterol levels by exercising and minimizing your intake of saturated fat. Eat a balanced diet with plenty of fruit and vegetables. Blood tests for lipids and cholesterol should begin at age 20 and be repeated every 5 years. If your lipid or cholesterol levels are high, you are over 50, or you are at high risk for heart disease, you may need your cholesterol levels checked more frequently.Ongoing high lipid and cholesterol levels should be treated with medicines if diet and exercise are not effective.  If you smoke, find out from your caregiver how to quit. If you do not use tobacco, do not start.  If you are pregnant, do not drink alcohol. If you are breastfeeding, be very cautious about drinking alcohol. If you are not pregnant and choose to drink alcohol, do not exceed 1 drink per day. One drink is considered to be 12 ounces (355 mL) of beer, 5 ounces (148 mL) of wine, or 1.5 ounces (44 mL) of liquor.  Avoid use of street drugs. Do not share needles with anyone. Ask for help if you need support or instructions about stopping the use of drugs.  High blood pressure causes heart disease and increases the risk of stroke. Your blood pressure should be checked at least every 1 to 2 years. Ongoing high blood pressure should be treated with medicines if weight loss and exercise are not effective.  If you are 55 to 61 years old, ask your caregiver if you should take aspirin to prevent strokes.  Diabetes   screening involves taking a blood sample to check your fasting blood sugar level. This should be done once every 3 years, after age 45, if you are within normal weight and without risk factors for diabetes. Testing should be considered at a younger age or be carried out more frequently if you are overweight and have at least 1 risk factor for diabetes.  Breast cancer screening is essential preventive care for women. You should practice "breast  self-awareness." This means understanding the normal appearance and feel of your breasts and may include breast self-examination. Any changes detected, no matter how small, should be reported to a caregiver. Women in their 20s and 30s should have a clinical breast exam (CBE) by a caregiver as part of a regular health exam every 1 to 3 years. After age 40, women should have a CBE every year. Starting at age 40, women should consider having a mammography (breast X-ray test) every year. Women who have a family history of breast cancer should talk to their caregiver about genetic screening. Women at a high risk of breast cancer should talk to their caregivers about having magnetic resonance imaging (MRI) and a mammography every year.  The Pap test is a screening test for cervical cancer. A Pap test can show cell changes on the cervix that might become cervical cancer if left untreated. A Pap test is a procedure in which cells are obtained and examined from the lower end of the uterus (cervix).  Women should have a Pap test starting at age 21.  Between ages 21 and 29, Pap tests should be repeated every 2 years.  Beginning at age 30, you should have a Pap test every 3 years as long as the past 3 Pap tests have been normal.  Some women have medical problems that increase the chance of getting cervical cancer. Talk to your caregiver about these problems. It is especially important to talk to your caregiver if a new problem develops soon after your last Pap test. In these cases, your caregiver may recommend more frequent screening and Pap tests.  The above recommendations are the same for women who have or have not gotten the vaccine for human papillomavirus (HPV).  If you had a hysterectomy for a problem that was not cancer or a condition that could lead to cancer, then you no longer need Pap tests. Even if you no longer need a Pap test, a regular exam is a good idea to make sure no other problems are  starting.  If you are between ages 65 and 70, and you have had normal Pap tests going back 10 years, you no longer need Pap tests. Even if you no longer need a Pap test, a regular exam is a good idea to make sure no other problems are starting.  If you have had past treatment for cervical cancer or a condition that could lead to cancer, you need Pap tests and screening for cancer for at least 20 years after your treatment.  If Pap tests have been discontinued, risk factors (such as a new sexual partner) need to be reassessed to determine if screening should be resumed.  The HPV test is an additional test that may be used for cervical cancer screening. The HPV test looks for the virus that can cause the cell changes on the cervix. The cells collected during the Pap test can be tested for HPV. The HPV test could be used to screen women aged 30 years and older, and should   be used in women of any age who have unclear Pap test results. After the age of 30, women should have HPV testing at the same frequency as a Pap test.  Colorectal cancer can be detected and often prevented. Most routine colorectal cancer screening begins at the age of 50 and continues through age 75. However, your caregiver may recommend screening at an earlier age if you have risk factors for colon cancer. On a yearly basis, your caregiver may provide home test kits to check for hidden blood in the stool. Use of a small camera at the end of a tube, to directly examine the colon (sigmoidoscopy or colonoscopy), can detect the earliest forms of colorectal cancer. Talk to your caregiver about this at age 50, when routine screening begins. Direct examination of the colon should be repeated every 5 to 10 years through age 75, unless early forms of pre-cancerous polyps or small growths are found.  Hepatitis C blood testing is recommended for all people born from 1945 through 1965 and any individual with known risks for hepatitis C.  Practice  safe sex. Use condoms and avoid high-risk sexual practices to reduce the spread of sexually transmitted infections (STIs). STIs include gonorrhea, chlamydia, syphilis, trichomonas, herpes, HPV, and human immunodeficiency virus (HIV). Herpes, HIV, and HPV are viral illnesses that have no cure. They can result in disability, cancer, and death. Sexually active women aged 25 and younger should be checked for chlamydia. Older women with new or multiple partners should also be tested for chlamydia. Testing for other STIs is recommended if you are sexually active and at increased risk.  Osteoporosis is a disease in which the bones lose minerals and strength with aging. This can result in serious bone fractures. The risk of osteoporosis can be identified using a bone density scan. Women ages 65 and over and women at risk for fractures or osteoporosis should discuss screening with their caregivers. Ask your caregiver whether you should take a calcium supplement or vitamin D to reduce the rate of osteoporosis.  Menopause can be associated with physical symptoms and risks. Hormone replacement therapy is available to decrease symptoms and risks. You should talk to your caregiver about whether hormone replacement therapy is right for you.  Use sunscreen with sun protection factor (SPF) of 30 or more. Apply sunscreen liberally and repeatedly throughout the day. You should seek shade when your shadow is shorter than you. Protect yourself by wearing long sleeves, pants, a wide-brimmed hat, and sunglasses year round, whenever you are outdoors.  Once a month, do a whole body skin exam, using a mirror to look at the skin on your back. Notify your caregiver of new moles, moles that have irregular borders, moles that are larger than a pencil eraser, or moles that have changed in shape or color.  Stay current with required immunizations.  Influenza. You need a dose every fall (or winter). The composition of the flu vaccine  changes each year, so being vaccinated once is not enough.  Pneumococcal polysaccharide. You need 1 to 2 doses if you smoke cigarettes or if you have certain chronic medical conditions. You need 1 dose at age 65 (or older) if you have never been vaccinated.  Tetanus, diphtheria, pertussis (Tdap, Td). Get 1 dose of Tdap vaccine if you are younger than age 65, are over 65 and have contact with an infant, are a healthcare worker, are pregnant, or simply want to be protected from whooping cough. After that, you need a Td   booster dose every 10 years. Consult your caregiver if you have not had at least 3 tetanus and diphtheria-containing shots sometime in your life or have a deep or dirty wound.  HPV. You need this vaccine if you are a woman age 26 or younger. The vaccine is given in 3 doses over 6 months.  Measles, mumps, rubella (MMR). You need at least 1 dose of MMR if you were born in 1957 or later. You may also need a second dose.  Meningococcal. If you are age 19 to 21 and a first-year college student living in a residence hall, or have one of several medical conditions, you need to get vaccinated against meningococcal disease. You may also need additional booster doses.  Zoster (shingles). If you are age 60 or older, you should get this vaccine.  Varicella (chickenpox). If you have never had chickenpox or you were vaccinated but received only 1 dose, talk to your caregiver to find out if you need this vaccine.  Hepatitis A. You need this vaccine if you have a specific risk factor for hepatitis A virus infection or you simply wish to be protected from this disease. The vaccine is usually given as 2 doses, 6 to 18 months apart.  Hepatitis B. You need this vaccine if you have a specific risk factor for hepatitis B virus infection or you simply wish to be protected from this disease. The vaccine is given in 3 doses, usually over 6 months. Preventive Services / Frequency Ages 19 to 39  Blood  pressure check.** / Every 1 to 2 years.  Lipid and cholesterol check.** / Every 5 years beginning at age 20.  Clinical breast exam.** / Every 3 years for women in their 20s and 30s.  Pap test.** / Every 2 years from ages 21 through 29. Every 3 years starting at age 30 through age 65 or 70 with a history of 3 consecutive normal Pap tests.  HPV screening.** / Every 3 years from ages 30 through ages 65 to 70 with a history of 3 consecutive normal Pap tests.  Hepatitis C blood test.** / For any individual with known risks for hepatitis C.  Skin self-exam. / Monthly.  Influenza immunization.** / Every year.  Pneumococcal polysaccharide immunization.** / 1 to 2 doses if you smoke cigarettes or if you have certain chronic medical conditions.  Tetanus, diphtheria, pertussis (Tdap, Td) immunization. / A one-time dose of Tdap vaccine. After that, you need a Td booster dose every 10 years.  HPV immunization. / 3 doses over 6 months, if you are 26 and younger.  Measles, mumps, rubella (MMR) immunization. / You need at least 1 dose of MMR if you were born in 1957 or later. You may also need a second dose.  Meningococcal immunization. / 1 dose if you are age 19 to 21 and a first-year college student living in a residence hall, or have one of several medical conditions, you need to get vaccinated against meningococcal disease. You may also need additional booster doses.  Varicella immunization.** / Consult your caregiver.  Hepatitis A immunization.** / Consult your caregiver. 2 doses, 6 to 18 months apart.  Hepatitis B immunization.** / Consult your caregiver. 3 doses usually over 6 months. Ages 40 to 64  Blood pressure check.** / Every 1 to 2 years.  Lipid and cholesterol check.** / Every 5 years beginning at age 20.  Clinical breast exam.** / Every year after age 40.  Mammogram.** / Every year beginning at age 40   and continuing for as long as you are in good health. Consult with your  caregiver.  Pap test.** / Every 3 years starting at age 30 through age 65 or 70 with a history of 3 consecutive normal Pap tests.  HPV screening.** / Every 3 years from ages 30 through ages 65 to 70 with a history of 3 consecutive normal Pap tests.  Fecal occult blood test (FOBT) of stool. / Every year beginning at age 50 and continuing until age 75. You may not need to do this test if you get a colonoscopy every 10 years.  Flexible sigmoidoscopy or colonoscopy.** / Every 5 years for a flexible sigmoidoscopy or every 10 years for a colonoscopy beginning at age 50 and continuing until age 75.  Hepatitis C blood test.** / For all people born from 1945 through 1965 and any individual with known risks for hepatitis C.  Skin self-exam. / Monthly.  Influenza immunization.** / Every year.  Pneumococcal polysaccharide immunization.** / 1 to 2 doses if you smoke cigarettes or if you have certain chronic medical conditions.  Tetanus, diphtheria, pertussis (Tdap, Td) immunization.** / A one-time dose of Tdap vaccine. After that, you need a Td booster dose every 10 years.  Measles, mumps, rubella (MMR) immunization. / You need at least 1 dose of MMR if you were born in 1957 or later. You may also need a second dose.  Varicella immunization.** / Consult your caregiver.  Meningococcal immunization.** / Consult your caregiver.  Hepatitis A immunization.** / Consult your caregiver. 2 doses, 6 to 18 months apart.  Hepatitis B immunization.** / Consult your caregiver. 3 doses, usually over 6 months. Ages 65 and over  Blood pressure check.** / Every 1 to 2 years.  Lipid and cholesterol check.** / Every 5 years beginning at age 20.  Clinical breast exam.** / Every year after age 40.  Mammogram.** / Every year beginning at age 40 and continuing for as long as you are in good health. Consult with your caregiver.  Pap test.** / Every 3 years starting at age 30 through age 65 or 70 with a 3  consecutive normal Pap tests. Testing can be stopped between 65 and 70 with 3 consecutive normal Pap tests and no abnormal Pap or HPV tests in the past 10 years.  HPV screening.** / Every 3 years from ages 30 through ages 65 or 70 with a history of 3 consecutive normal Pap tests. Testing can be stopped between 65 and 70 with 3 consecutive normal Pap tests and no abnormal Pap or HPV tests in the past 10 years.  Fecal occult blood test (FOBT) of stool. / Every year beginning at age 50 and continuing until age 75. You may not need to do this test if you get a colonoscopy every 10 years.  Flexible sigmoidoscopy or colonoscopy.** / Every 5 years for a flexible sigmoidoscopy or every 10 years for a colonoscopy beginning at age 50 and continuing until age 75.  Hepatitis C blood test.** / For all people born from 1945 through 1965 and any individual with known risks for hepatitis C.  Osteoporosis screening.** / A one-time screening for women ages 65 and over and women at risk for fractures or osteoporosis.  Skin self-exam. / Monthly.  Influenza immunization.** / Every year.  Pneumococcal polysaccharide immunization.** / 1 dose at age 65 (or older) if you have never been vaccinated.  Tetanus, diphtheria, pertussis (Tdap, Td) immunization. / A one-time dose of Tdap vaccine if you are over   65 and have contact with an infant, are a healthcare worker, or simply want to be protected from whooping cough. After that, you need a Td booster dose every 10 years.  Varicella immunization.** / Consult your caregiver.  Meningococcal immunization.** / Consult your caregiver.  Hepatitis A immunization.** / Consult your caregiver. 2 doses, 6 to 18 months apart.  Hepatitis B immunization.** / Check with your caregiver. 3 doses, usually over 6 months. ** Family history and personal history of risk and conditions may change your caregiver's recommendations. Document Released: 06/06/2001 Document Revised: 07/03/2011  Document Reviewed: 09/05/2010 [employer] Patient Information 2014 [employer]  

## 2013-02-27 ENCOUNTER — Other Ambulatory Visit: Payer: Self-pay

## 2013-03-14 ENCOUNTER — Encounter: Payer: Self-pay | Admitting: Family Medicine

## 2013-08-28 ENCOUNTER — Other Ambulatory Visit: Payer: Self-pay | Admitting: Family Medicine

## 2013-10-14 ENCOUNTER — Encounter: Payer: Self-pay | Admitting: Family Medicine

## 2013-10-14 ENCOUNTER — Other Ambulatory Visit: Payer: Self-pay

## 2013-10-14 DIAGNOSIS — Z1231 Encounter for screening mammogram for malignant neoplasm of breast: Secondary | ICD-10-CM

## 2013-11-06 ENCOUNTER — Ambulatory Visit
Admission: RE | Admit: 2013-11-06 | Discharge: 2013-11-06 | Disposition: A | Discharge status: Home / Self Care | Payer: BC Managed Care – PPO | Source: Ambulatory Visit

## 2013-11-06 DIAGNOSIS — Z1231 Encounter for screening mammogram for malignant neoplasm of breast: Secondary | ICD-10-CM

## 2013-12-24 ENCOUNTER — Other Ambulatory Visit: Payer: Self-pay | Admitting: Obstetrics and Gynecology

## 2013-12-24 DIAGNOSIS — N6459 Other signs and symptoms in breast: Secondary | ICD-10-CM

## 2013-12-31 ENCOUNTER — Ambulatory Visit
Admission: RE | Admit: 2013-12-31 | Discharge: 2013-12-31 | Disposition: A | Discharge status: Home / Self Care | Payer: BC Managed Care – PPO | Source: Ambulatory Visit | Attending: Obstetrics and Gynecology | Admitting: Obstetrics and Gynecology

## 2013-12-31 ENCOUNTER — Other Ambulatory Visit: Payer: Self-pay | Admitting: Obstetrics and Gynecology

## 2013-12-31 DIAGNOSIS — N6459 Other signs and symptoms in breast: Secondary | ICD-10-CM

## 2014-01-07 ENCOUNTER — Encounter: Payer: BC Managed Care – PPO | Admitting: Family Medicine

## 2014-01-16 ENCOUNTER — Encounter: Payer: BC Managed Care – PPO | Admitting: Family Medicine

## 2014-02-06 ENCOUNTER — Other Ambulatory Visit: Payer: Self-pay

## 2014-02-09 ENCOUNTER — Other Ambulatory Visit: Payer: Self-pay | Admitting: Family Medicine

## 2014-02-11 ENCOUNTER — Encounter: Payer: Self-pay | Admitting: Family Medicine

## 2014-03-13 ENCOUNTER — Ambulatory Visit (INDEPENDENT_AMBULATORY_CARE_PROVIDER_SITE_OTHER): Payer: BC Managed Care – PPO | Admitting: Family Medicine

## 2014-03-13 ENCOUNTER — Encounter: Payer: Self-pay | Admitting: Family Medicine

## 2014-03-13 VITALS — BP 122/74 | HR 59 | Temp 97.9°F | Ht 65.5 in | Wt 155.8 lb

## 2014-03-13 DIAGNOSIS — Z0000 Encounter for general adult medical examination without abnormal findings: Secondary | ICD-10-CM

## 2014-03-13 DIAGNOSIS — M79671 Pain in right foot: Secondary | ICD-10-CM

## 2014-03-13 DIAGNOSIS — J302 Other seasonal allergic rhinitis: Secondary | ICD-10-CM

## 2014-03-13 MED ORDER — FLUTICASONE PROPIONATE 50 MCG/ACT NA SUSP: NASAL | Status: DC

## 2014-03-13 NOTE — Patient Instructions (Signed)
Preventive Care for Adults A healthy lifestyle and preventive care can promote health and wellness. Preventive health guidelines for women include the following key practices.  A routine yearly physical is a good way to check with your health care provider about your health and preventive screening. It is a chance to share any concerns and updates on your health and to receive a thorough exam.  Visit your dentist for a routine exam and preventive care every 6 months. Brush your teeth twice a day and floss once a day. Good oral hygiene prevents tooth decay and gum disease.  The frequency of eye exams is based on your age, health, family medical history, use of contact lenses, and other factors. Follow your health care provider's recommendations for frequency of eye exams.  Eat a healthy diet. Foods like vegetables, fruits, whole grains, low-fat dairy products, and lean protein foods contain the nutrients you need without too many calories. Decrease your intake of foods high in solid fats, added sugars, and salt. Eat the right amount of calories for you.Get information about a proper diet from your health care provider, if necessary.  Regular physical exercise is one of the most important things you can do for your health. Most adults should get at least 150 minutes of moderate-intensity exercise (any activity that increases your heart rate and causes you to sweat) each week. In addition, most adults need muscle-strengthening exercises on 2 or more days a week.  Maintain a healthy weight. The body mass index (BMI) is a screening tool to identify possible weight problems. It provides an estimate of body fat based on height and weight. Your health care provider can find your BMI and can help you achieve or maintain a healthy weight.For adults 20 years and older:  A BMI below 18.5 is considered underweight.  A BMI of 18.5 to 24.9 is normal.  A BMI of 25 to 29.9 is considered overweight.  A BMI of  30 and above is considered obese.  Maintain normal blood lipids and cholesterol levels by exercising and minimizing your intake of saturated fat. Eat a balanced diet with plenty of fruit and vegetables. Blood tests for lipids and cholesterol should begin at age 76 and be repeated every 5 years. If your lipid or cholesterol levels are high, you are over 50, or you are at high risk for heart disease, you may need your cholesterol levels checked more frequently.Ongoing high lipid and cholesterol levels should be treated with medicines if diet and exercise are not working.  If you smoke, find out from your health care provider how to quit. If you do not use tobacco, do not start.  Lung cancer screening is recommended for adults aged 22-80 years who are at high risk for developing lung cancer because of a history of smoking. A yearly low-dose CT scan of the lungs is recommended for people who have at least a 30-pack-year history of smoking and are a current smoker or have quit within the past 15 years. A pack year of smoking is smoking an average of 1 pack of cigarettes a day for 1 year (for example: 1 pack a day for 30 years or 2 packs a day for 15 years). Yearly screening should continue until the smoker has stopped smoking for at least 15 years. Yearly screening should be stopped for people who develop a health problem that would prevent them from having lung cancer treatment.  If you are pregnant, do not drink alcohol. If you are breastfeeding,  be very cautious about drinking alcohol. If you are not pregnant and choose to drink alcohol, do not have more than 1 drink per day. One drink is considered to be 12 ounces (355 mL) of beer, 5 ounces (148 mL) of wine, or 1.5 ounces (44 mL) of liquor.  Avoid use of street drugs. Do not share needles with anyone. Ask for help if you need support or instructions about stopping the use of drugs.  High blood pressure causes heart disease and increases the risk of  stroke. Your blood pressure should be checked at least every 1 to 2 years. Ongoing high blood pressure should be treated with medicines if weight loss and exercise do not work.  If you are 75-52 years old, ask your health care provider if you should take aspirin to prevent strokes.  Diabetes screening involves taking a blood sample to check your fasting blood sugar level. This should be done once every 3 years, after age 15, if you are within normal weight and without risk factors for diabetes. Testing should be considered at a younger age or be carried out more frequently if you are overweight and have at least 1 risk factor for diabetes.  Breast cancer screening is essential preventive care for women. You should practice "breast self-awareness." This means understanding the normal appearance and feel of your breasts and may include breast self-examination. Any changes detected, no matter how small, should be reported to a health care provider. Women in their 58s and 30s should have a clinical breast exam (CBE) by a health care provider as part of a regular health exam every 1 to 3 years. After age 16, women should have a CBE every year. Starting at age 53, women should consider having a mammogram (breast X-ray test) every year. Women who have a family history of breast cancer should talk to their health care provider about genetic screening. Women at a high risk of breast cancer should talk to their health care providers about having an MRI and a mammogram every year.  Breast cancer gene (BRCA)-related cancer risk assessment is recommended for women who have family members with BRCA-related cancers. BRCA-related cancers include breast, ovarian, tubal, and peritoneal cancers. Having family members with these cancers may be associated with an increased risk for harmful changes (mutations) in the breast cancer genes BRCA1 and BRCA2. Results of the assessment will determine the need for genetic counseling and  BRCA1 and BRCA2 testing.  Routine pelvic exams to screen for cancer are no longer recommended for nonpregnant women who are considered low risk for cancer of the pelvic organs (ovaries, uterus, and vagina) and who do not have symptoms. Ask your health care provider if a screening pelvic exam is right for you.  If you have had past treatment for cervical cancer or a condition that could lead to cancer, you need Pap tests and screening for cancer for at least 20 years after your treatment. If Pap tests have been discontinued, your risk factors (such as having a new sexual partner) need to be reassessed to determine if screening should be resumed. Some women have medical problems that increase the chance of getting cervical cancer. In these cases, your health care provider may recommend more frequent screening and Pap tests.  The HPV test is an additional test that may be used for cervical cancer screening. The HPV test looks for the virus that can cause the cell changes on the cervix. The cells collected during the Pap test can be  tested for HPV. The HPV test could be used to screen women aged 30 years and older, and should be used in women of any age who have unclear Pap test results. After the age of 30, women should have HPV testing at the same frequency as a Pap test.  Colorectal cancer can be detected and often prevented. Most routine colorectal cancer screening begins at the age of 50 years and continues through age 75 years. However, your health care provider may recommend screening at an earlier age if you have risk factors for colon cancer. On a yearly basis, your health care provider may provide home test kits to check for hidden blood in the stool. Use of a small camera at the end of a tube, to directly examine the colon (sigmoidoscopy or colonoscopy), can detect the earliest forms of colorectal cancer. Talk to your health care provider about this at age 50, when routine screening begins. Direct  exam of the colon should be repeated every 5-10 years through age 75 years, unless early forms of pre-cancerous polyps or small growths are found.  People who are at an increased risk for hepatitis B should be screened for this virus. You are considered at high risk for hepatitis B if:  You were born in a country where hepatitis B occurs often. Talk with your health care provider about which countries are considered high risk.  Your parents were born in a high-risk country and you have not received a shot to protect against hepatitis B (hepatitis B vaccine).  You have HIV or AIDS.  You use needles to inject street drugs.  You live with, or have sex with, someone who has hepatitis B.  You get hemodialysis treatment.  You take certain medicines for conditions like cancer, organ transplantation, and autoimmune conditions.  Hepatitis C blood testing is recommended for all people born from 1945 through 1965 and any individual with known risks for hepatitis C.  Practice safe sex. Use condoms and avoid high-risk sexual practices to reduce the spread of sexually transmitted infections (STIs). STIs include gonorrhea, chlamydia, syphilis, trichomonas, herpes, HPV, and human immunodeficiency virus (HIV). Herpes, HIV, and HPV are viral illnesses that have no cure. They can result in disability, cancer, and death.  You should be screened for sexually transmitted illnesses (STIs) including gonorrhea and chlamydia if:  You are sexually active and are younger than 24 years.  You are older than 24 years and your health care provider tells you that you are at risk for this type of infection.  Your sexual activity has changed since you were last screened and you are at an increased risk for chlamydia or gonorrhea. Ask your health care provider if you are at risk.  If you are at risk of being infected with HIV, it is recommended that you take a prescription medicine daily to prevent HIV infection. This is  called preexposure prophylaxis (PrEP). You are considered at risk if:  You are a heterosexual woman, are sexually active, and are at increased risk for HIV infection.  You take drugs by injection.  You are sexually active with a partner who has HIV.  Talk with your health care provider about whether you are at high risk of being infected with HIV. If you choose to begin PrEP, you should first be tested for HIV. You should then be tested every 3 months for as long as you are taking PrEP.  Osteoporosis is a disease in which the bones lose minerals and strength   with aging. This can result in serious bone fractures or breaks. The risk of osteoporosis can be identified using a bone density scan. Women ages 65 years and over and women at risk for fractures or osteoporosis should discuss screening with their health care providers. Ask your health care provider whether you should take a calcium supplement or vitamin D to reduce the rate of osteoporosis.  Menopause can be associated with physical symptoms and risks. Hormone replacement therapy is available to decrease symptoms and risks. You should talk to your health care provider about whether hormone replacement therapy is right for you.  Use sunscreen. Apply sunscreen liberally and repeatedly throughout the day. You should seek shade when your shadow is shorter than you. Protect yourself by wearing long sleeves, pants, a wide-brimmed hat, and sunglasses year round, whenever you are outdoors.  Once a month, do a whole body skin exam, using a mirror to look at the skin on your back. Tell your health care provider of new moles, moles that have irregular borders, moles that are larger than a pencil eraser, or moles that have changed in shape or color.  Stay current with required vaccines (immunizations).  Influenza vaccine. All adults should be immunized every year.  Tetanus, diphtheria, and acellular pertussis (Td, Tdap) vaccine. Pregnant women should  receive 1 dose of Tdap vaccine during each pregnancy. The dose should be obtained regardless of the length of time since the last dose. Immunization is preferred during the 27th-36th week of gestation. An adult who has not previously received Tdap or who does not know her vaccine status should receive 1 dose of Tdap. This initial dose should be followed by tetanus and diphtheria toxoids (Td) booster doses every 10 years. Adults with an unknown or incomplete history of completing a 3-dose immunization series with Td-containing vaccines should begin or complete a primary immunization series including a Tdap dose. Adults should receive a Td booster every 10 years.  Varicella vaccine. An adult without evidence of immunity to varicella should receive 2 doses or a second dose if she has previously received 1 dose. Pregnant females who do not have evidence of immunity should receive the first dose after pregnancy. This first dose should be obtained before leaving the health care facility. The second dose should be obtained 4-8 weeks after the first dose.  Human papillomavirus (HPV) vaccine. Females aged 13-26 years who have not received the vaccine previously should obtain the 3-dose series. The vaccine is not recommended for use in pregnant females. However, pregnancy testing is not needed before receiving a dose. If a female is found to be pregnant after receiving a dose, no treatment is needed. In that case, the remaining doses should be delayed until after the pregnancy. Immunization is recommended for any person with an immunocompromised condition through the age of 26 years if she did not get any or all doses earlier. During the 3-dose series, the second dose should be obtained 4-8 weeks after the first dose. The third dose should be obtained 24 weeks after the first dose and 16 weeks after the second dose.  Zoster vaccine. One dose is recommended for adults aged 60 years or older unless certain conditions are  present.  Measles, mumps, and rubella (MMR) vaccine. Adults born before 1957 generally are considered immune to measles and mumps. Adults born in 1957 or later should have 1 or more doses of MMR vaccine unless there is a contraindication to the vaccine or there is laboratory evidence of immunity to   each of the three diseases. A routine second dose of MMR vaccine should be obtained at least 28 days after the first dose for students attending postsecondary schools, health care workers, or international travelers. People who received inactivated measles vaccine or an unknown type of measles vaccine during 1963-1967 should receive 2 doses of MMR vaccine. People who received inactivated mumps vaccine or an unknown type of mumps vaccine before 1979 and are at high risk for mumps infection should consider immunization with 2 doses of MMR vaccine. For females of childbearing age, rubella immunity should be determined. If there is no evidence of immunity, females who are not pregnant should be vaccinated. If there is no evidence of immunity, females who are pregnant should delay immunization until after pregnancy. Unvaccinated health care workers born before 1957 who lack laboratory evidence of measles, mumps, or rubella immunity or laboratory confirmation of disease should consider measles and mumps immunization with 2 doses of MMR vaccine or rubella immunization with 1 dose of MMR vaccine.  Pneumococcal 13-valent conjugate (PCV13) vaccine. When indicated, a person who is uncertain of her immunization history and has no record of immunization should receive the PCV13 vaccine. An adult aged 19 years or older who has certain medical conditions and has not been previously immunized should receive 1 dose of PCV13 vaccine. This PCV13 should be followed with a dose of pneumococcal polysaccharide (PPSV23) vaccine. The PPSV23 vaccine dose should be obtained at least 8 weeks after the dose of PCV13 vaccine. An adult aged 19  years or older who has certain medical conditions and previously received 1 or more doses of PPSV23 vaccine should receive 1 dose of PCV13. The PCV13 vaccine dose should be obtained 1 or more years after the last PPSV23 vaccine dose.  Pneumococcal polysaccharide (PPSV23) vaccine. When PCV13 is also indicated, PCV13 should be obtained first. All adults aged 65 years and older should be immunized. An adult younger than age 65 years who has certain medical conditions should be immunized. Any person who resides in a nursing home or long-term care facility should be immunized. An adult smoker should be immunized. People with an immunocompromised condition and certain other conditions should receive both PCV13 and PPSV23 vaccines. People with human immunodeficiency virus (HIV) infection should be immunized as soon as possible after diagnosis. Immunization during chemotherapy or radiation therapy should be avoided. Routine use of PPSV23 vaccine is not recommended for American Indians, [place] Natives, or people younger than 65 years unless there are medical conditions that require PPSV23 vaccine. When indicated, people who have unknown immunization and have no record of immunization should receive PPSV23 vaccine. One-time revaccination 5 years after the first dose of PPSV23 is recommended for people aged 19-64 years who have chronic kidney failure, nephrotic syndrome, asplenia, or immunocompromised conditions. People who received 1-2 doses of PPSV23 before age 65 years should receive another dose of PPSV23 vaccine at age 65 years or later if at least 5 years have passed since the previous dose. Doses of PPSV23 are not needed for people immunized with PPSV23 at or after age 65 years.  Meningococcal vaccine. Adults with asplenia or persistent complement component deficiencies should receive 2 doses of quadrivalent meningococcal conjugate (MenACWY-D) vaccine. The doses should be obtained at least 2 months apart.  Microbiologists working with certain meningococcal bacteria, military recruits, people at risk during an outbreak, and people who travel to or live in countries with a high rate of meningitis should be immunized. A first-year college student up through age   21 years who is living in a residence hall should receive a dose if she did not receive a dose on or after her 16th birthday. Adults who have certain high-risk conditions should receive one or more doses of vaccine.  Hepatitis A vaccine. Adults who wish to be protected from this disease, have certain high-risk conditions, work with hepatitis A-infected animals, work in hepatitis A research labs, or travel to or work in countries with a high rate of hepatitis A should be immunized. Adults who were previously unvaccinated and who anticipate close contact with an international adoptee during the first 60 days after arrival in the [place] from a country with a high rate of hepatitis A should be immunized.  Hepatitis B vaccine. Adults who wish to be protected from this disease, have certain high-risk conditions, may be exposed to blood or other infectious body fluids, are household contacts or sex partners of hepatitis B positive people, are clients or workers in certain care facilities, or travel to or work in countries with a high rate of hepatitis B should be immunized.  Haemophilus influenzae type b (Hib) vaccine. A previously unvaccinated person with asplenia or sickle cell disease or having a scheduled splenectomy should receive 1 dose of Hib vaccine. Regardless of previous immunization, a recipient of a hematopoietic stem cell transplant should receive a 3-dose series 6-12 months after her successful transplant. Hib vaccine is not recommended for adults with HIV infection. Preventive Services / Frequency Ages 64 to 68 years  Blood pressure check.** / Every 1 to 2 years.  Lipid and cholesterol check.** / Every 5 years beginning at age  22.  Clinical breast exam.** / Every 3 years for women in their 88s and 53s.  BRCA-related cancer risk assessment.** / For women who have family members with a BRCA-related cancer (breast, ovarian, tubal, or peritoneal cancers).  Pap test.** / Every 2 years from ages 90 through 51. Every 3 years starting at age 21 through age 56 or 3 with a history of 3 consecutive normal Pap tests.  HPV screening.** / Every 3 years from ages 24 through ages 1 to 46 with a history of 3 consecutive normal Pap tests.  Hepatitis C blood test.** / For any individual with known risks for hepatitis C.  Skin self-exam. / Monthly.  Influenza vaccine. / Every year.  Tetanus, diphtheria, and acellular pertussis (Tdap, Td) vaccine.** / Consult your health care provider. Pregnant women should receive 1 dose of Tdap vaccine during each pregnancy. 1 dose of Td every 10 years.  Varicella vaccine.** / Consult your health care provider. Pregnant females who do not have evidence of immunity should receive the first dose after pregnancy.  HPV vaccine. / 3 doses over 6 months, if 72 and younger. The vaccine is not recommended for use in pregnant females. However, pregnancy testing is not needed before receiving a dose.  Measles, mumps, rubella (MMR) vaccine.** / You need at least 1 dose of MMR if you were born in 1957 or later. You may also need a 2nd dose. For females of childbearing age, rubella immunity should be determined. If there is no evidence of immunity, females who are not pregnant should be vaccinated. If there is no evidence of immunity, females who are pregnant should delay immunization until after pregnancy.  Pneumococcal 13-valent conjugate (PCV13) vaccine.** / Consult your health care provider.  Pneumococcal polysaccharide (PPSV23) vaccine.** / 1 to 2 doses if you smoke cigarettes or if you have certain conditions.  Meningococcal vaccine.** /  1 dose if you are age 19 to 21 years and a first-year college  student living in a residence hall, or have one of several medical conditions, you need to get vaccinated against meningococcal disease. You may also need additional booster doses.  Hepatitis A vaccine.** / Consult your health care provider.  Hepatitis B vaccine.** / Consult your health care provider.  Haemophilus influenzae type b (Hib) vaccine.** / Consult your health care provider. Ages 40 to 64 years  Blood pressure check.** / Every 1 to 2 years.  Lipid and cholesterol check.** / Every 5 years beginning at age 20 years.  Lung cancer screening. / Every year if you are aged 55-80 years and have a 30-pack-year history of smoking and currently smoke or have quit within the past 15 years. Yearly screening is stopped once you have quit smoking for at least 15 years or develop a health problem that would prevent you from having lung cancer treatment.  Clinical breast exam.** / Every year after age 40 years.  BRCA-related cancer risk assessment.** / For women who have family members with a BRCA-related cancer (breast, ovarian, tubal, or peritoneal cancers).  Mammogram.** / Every year beginning at age 40 years and continuing for as long as you are in good health. Consult with your health care provider.  Pap test.** / Every 3 years starting at age 30 years through age 65 or 70 years with a history of 3 consecutive normal Pap tests.  HPV screening.** / Every 3 years from ages 30 years through ages 65 to 70 years with a history of 3 consecutive normal Pap tests.  Fecal occult blood test (FOBT) of stool. / Every year beginning at age 50 years and continuing until age 75 years. You may not need to do this test if you get a colonoscopy every 10 years.  Flexible sigmoidoscopy or colonoscopy.** / Every 5 years for a flexible sigmoidoscopy or every 10 years for a colonoscopy beginning at age 50 years and continuing until age 75 years.  Hepatitis C blood test.** / For all people born from 1945 through  1965 and any individual with known risks for hepatitis C.  Skin self-exam. / Monthly.  Influenza vaccine. / Every year.  Tetanus, diphtheria, and acellular pertussis (Tdap/Td) vaccine.** / Consult your health care provider. Pregnant women should receive 1 dose of Tdap vaccine during each pregnancy. 1 dose of Td every 10 years.  Varicella vaccine.** / Consult your health care provider. Pregnant females who do not have evidence of immunity should receive the first dose after pregnancy.  Zoster vaccine.** / 1 dose for adults aged 60 years or older.  Measles, mumps, rubella (MMR) vaccine.** / You need at least 1 dose of MMR if you were born in 1957 or later. You may also need a 2nd dose. For females of childbearing age, rubella immunity should be determined. If there is no evidence of immunity, females who are not pregnant should be vaccinated. If there is no evidence of immunity, females who are pregnant should delay immunization until after pregnancy.  Pneumococcal 13-valent conjugate (PCV13) vaccine.** / Consult your health care provider.  Pneumococcal polysaccharide (PPSV23) vaccine.** / 1 to 2 doses if you smoke cigarettes or if you have certain conditions.  Meningococcal vaccine.** / Consult your health care provider.  Hepatitis A vaccine.** / Consult your health care provider.  Hepatitis B vaccine.** / Consult your health care provider.  Haemophilus influenzae type b (Hib) vaccine.** / Consult your health care provider. Ages 65   years and over  Blood pressure check.** / Every 1 to 2 years.  Lipid and cholesterol check.** / Every 5 years beginning at age 22 years.  Lung cancer screening. / Every year if you are aged 73-80 years and have a 30-pack-year history of smoking and currently smoke or have quit within the past 15 years. Yearly screening is stopped once you have quit smoking for at least 15 years or develop a health problem that would prevent you from having lung cancer  treatment.  Clinical breast exam.** / Every year after age 4 years.  BRCA-related cancer risk assessment.** / For women who have family members with a BRCA-related cancer (breast, ovarian, tubal, or peritoneal cancers).  Mammogram.** / Every year beginning at age 40 years and continuing for as long as you are in good health. Consult with your health care provider.  Pap test.** / Every 3 years starting at age 9 years through age 34 or 91 years with 3 consecutive normal Pap tests. Testing can be stopped between 65 and 70 years with 3 consecutive normal Pap tests and no abnormal Pap or HPV tests in the past 10 years.  HPV screening.** / Every 3 years from ages 57 years through ages 64 or 45 years with a history of 3 consecutive normal Pap tests. Testing can be stopped between 65 and 70 years with 3 consecutive normal Pap tests and no abnormal Pap or HPV tests in the past 10 years.  Fecal occult blood test (FOBT) of stool. / Every year beginning at age 15 years and continuing until age 17 years. You may not need to do this test if you get a colonoscopy every 10 years.  Flexible sigmoidoscopy or colonoscopy.** / Every 5 years for a flexible sigmoidoscopy or every 10 years for a colonoscopy beginning at age 86 years and continuing until age 71 years.  Hepatitis C blood test.** / For all people born from [birth year] through 1964 and any individual with known risks for hepatitis C.  Osteoporosis screening.** / A one-time screening for women ages 83 years and over and women at risk for fractures or osteoporosis.  Skin self-exam. / Monthly.  Influenza vaccine. / Every year.  Tetanus, diphtheria, and acellular pertussis (Tdap/Td) vaccine.** / 1 dose of Td every 10 years.  Varicella vaccine.** / Consult your health care provider.  Zoster vaccine.** / 1 dose for adults aged 61 years or older.  Pneumococcal 13-valent conjugate (PCV13) vaccine.** / Consult your health care provider.  Pneumococcal  polysaccharide (PPSV23) vaccine.** / 1 dose for all adults aged 28 years and older.  Meningococcal vaccine.** / Consult your health care provider.  Hepatitis A vaccine.** / Consult your health care provider.  Hepatitis B vaccine.** / Consult your health care provider.  Haemophilus influenzae type b (Hib) vaccine.** / Consult your health care provider. ** Family history and personal history of risk and conditions may change your health care provider's recommendations. Document Released: 06/06/2001 Document Revised: 08/25/2013 Document Reviewed: 09/05/2010 [facility] Patient Information 2015 [place], [place]. This information is not intended to replace advice given to you by your health care provider. Make sure you discuss any questions you have with your health care provider.

## 2014-03-13 NOTE — Progress Notes (Signed)
Pre visit review using our clinic review tool, if applicable. No additional management support is needed unless otherwise documented below in the visit note. 

## 2014-03-13 NOTE — Progress Notes (Signed)
Subjective:     Laura Price is a 63 y.o. female and is here for a comprehensive physical exam. The patient reports no problems.  History   Social History  . Marital Status: Married    Spouse Name: N/A    Number of Children: N/A  . 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 Smoking status: Never Smoker   . Smokeless tobacco: Never Used  . Alcohol Use: 1.2 oz/week    2 Glasses of wine per week  . Drug Use: No  . Sexual Activity:    Partners: Male   Other Topics Concern  . Not on file   Social History Narrative   Exercise-- walk almost everyday   Health Maintenance  Topic Date Due  . ZOSTAVAX  10/07/2011  . INFLUENZA VACCINE  11/23/2014  . PAP SMEAR  12/05/2015  . MAMMOGRAM  01/01/2016  . TETANUS/TDAP  11/21/2017  . COLONOSCOPY  11/01/2022    The following portions of the patient's history were reviewed and updated as appropriate:  She  has a past medical history of Allergic rhinitis. She  does not have any pertinent problems on file. She  has past surgical history that includes Total abdominal hysterectomy w/ bilateral salpingoophorectomy; Breast lumpectomy; and Myringotomy. Her family history includes Arthritis in her brother; Atrial fibrillation in her mother; Breast cancer (age of onset: 54) in her mother; Cancer (age of onset: 55) in her father and mother; Heart disease in her mother; Hypertension in her father and mother; Osteoarthritis (age of onset: 11) in an other family member; Prostate cancer in her father; Thyroid disease in her brother, mother, and sister. There is no history of Colon cancer. She  reports that she has never smoked. She has never used smokeless tobacco. She reports that she drinks about 1.2 oz of alcohol per week. She reports that she does not use illicit drugs. She has a current medication list which includes the following prescription(s): aspirin,  calcium-phosphorus-vitamin d, fluticasone, multivitamin gummies womens, and vivelle-dot. Current Outpatient Prescriptions on File Prior to Visit  Medication Sig Dispense Refill  . aspirin 81 MG tablet Take 81 mg by mouth daily.    [name] VIVELLE-DOT 0.075 MG/24HR Place 1 patch onto the skin 2 (two) times a week.     No current facility-administered medications on file prior to visit.   She is allergic to sulfonamide derivatives..  Review of Systems Review of Systems  Constitutional: Negative for activity change, appetite change and fatigue.  HENT: Negative for hearing loss, congestion, tinnitus and ear discharge.  dentist q67m Eyes: Negative for visual disturbance (see optho q1y -- vision corrected to 20/20 with glasses).  Respiratory: Negative for cough, chest tightness and shortness of breath.   Cardiovascular: Negative for chest pain, palpitations and leg swelling.  Gastrointestinal: Negative for abdominal pain, diarrhea, constipation and abdominal distention.  Genitourinary: Negative for urgency, frequency, decreased urine volume and difficulty urinating.  Musculoskeletal: Negative for back pain, arthralgias and gait problem.  Skin: Negative for color change, pallor and rash.  Neurological: Negative for dizziness, light-headedness, numbness and headaches.  Hematological: Negative for adenopathy. Does not bruise/bleed easily.  Psychiatric/Behavioral: Negative for suicidal ideas, confusion, sleep disturbance, self-injury, dysphoric mood, decreased concentration and agitation.       Objective:    BP 122/74 mmHg  Pulse 59  Temp(Src) 97.9 F (36.6 C) (Oral)  Ht 5' 5.5" (1.664 m)  Wt 155 lb 12.8 oz (70.67 kg)  BMI 25.52 kg/m2  SpO2 97% General appearance: alert, cooperative, appears stated age and no distress Head: Normocephalic, without obvious abnormality, atraumatic Eyes: conjunctivae/corneas clear. PERRL, EOM's intact. Fundi benign. Ears: normal TM's and external ear canals both  ears Nose: Nares normal. Septum midline. Mucosa normal. No drainage or sinus tenderness. Throat: lips, mucosa, and tongue normal; teeth and gums normal Neck: no adenopathy, no carotid bruit, no JVD, supple, symmetrical, trachea midline and thyroid not enlarged, symmetric, no tenderness/mass/nodules Back: symmetric, no curvature. ROM normal. No CVA tenderness. Lungs: clear to auscultation bilaterally Breasts: gyn Heart: regular rate and rhythm, S1, S2 normal, no murmur, click, rub or gallop Abdomen: soft, non-tender; bowel sounds normal; no masses,  no organomegaly  Pelvic: deferred -gynl Extremities: extremities normal, atraumatic, no cyanosis or edema -- pain L foot---3-44metatarsals with palpation and numbness in toes Pulses: 2+ and symmetric Skin: Skin color, texture, turgor normal. No rashes or lesions Lymph nodes: Cervical, supraclavicular, and axillary nodes normal. Neurologic: Alert and oriented X 3, normal strength and tone. Normal symmetric reflexes. Normal coordination and gait Psych-no depression, no anxiety      Assessment:    Healthy female exam.       Plan:    ghm utd Check labs See After Visit Summary for Counseling Recommendations   1. Seasonal allergies   - fluticasone (FLONASE) 50 MCG/ACT nasal spray; USE 1 SPRAY INTO EACH NOSTRIL TWICE DAILY  Dispense: 16 g; Refill: 5  2. Preventative health care ghm utd Check labs See AVS - Basic metabolic panel; Future - CBC with Differential; Future - Hepatic function panel; Future - Lipid panel; Future - Microalbumin / creatinine urine ratio; Future - POCT urinalysis dipstick; Future - TSH; Future  3. Pain of right foot  ? Morton neuroma - Ambulatory referral to Sports Medicine

## 2014-03-23 ENCOUNTER — Other Ambulatory Visit (INDEPENDENT_AMBULATORY_CARE_PROVIDER_SITE_OTHER): Payer: BC Managed Care – PPO

## 2014-03-23 ENCOUNTER — Ambulatory Visit (INDEPENDENT_AMBULATORY_CARE_PROVIDER_SITE_OTHER): Payer: BC Managed Care – PPO | Admitting: Family Medicine

## 2014-03-23 ENCOUNTER — Encounter: Payer: Self-pay | Admitting: Family Medicine

## 2014-03-23 ENCOUNTER — Ambulatory Visit: Payer: BC Managed Care – PPO | Admitting: Family Medicine

## 2014-03-23 VITALS — BP 132/84 | HR 59 | Ht 66.0 in | Wt 152.0 lb

## 2014-03-23 DIAGNOSIS — M79671 Pain in right foot: Secondary | ICD-10-CM

## 2014-03-23 DIAGNOSIS — Z0000 Encounter for general adult medical examination without abnormal findings: Secondary | ICD-10-CM

## 2014-03-23 DIAGNOSIS — Z23 Encounter for immunization: Secondary | ICD-10-CM

## 2014-03-23 LAB — BASIC METABOLIC PANEL
BUN: 11 mg/dL (ref 6–23)
CALCIUM: 8.4 mg/dL (ref 8.4–10.5)
CO2: 23 meq/L (ref 19–32)
Chloride: 99 mEq/L (ref 96–112)
Creatinine, Ser: 0.5 mg/dL (ref 0.4–1.2)
GFR: 121.4 mL/min (ref >60.00)
Glucose, Bld: 91 mg/dL (ref 70–99)
Potassium: 3.9 mEq/L (ref 3.5–5.1)
SODIUM: 131 meq/L — AB (ref 135–145)

## 2014-03-23 LAB — LIPID PANEL
Cholesterol: 170 mg/dL (ref 0–200)
HDL: 75.6 mg/dL (ref >39.00)
LDL Cholesterol: 84 mg/dL (ref 0–99)
NONHDL: 94.4
Total CHOL/HDL Ratio: 2
Triglycerides: 51 mg/dL (ref 0.0–149.0)
VLDL: 10.2 mg/dL (ref 0.0–40.0)

## 2014-03-23 LAB — CBC WITH DIFFERENTIAL/PLATELET
BASOS PCT: 0.7 % (ref 0.0–3.0)
Basophils Absolute: 0 10*3/uL (ref 0.0–0.1)
EOS ABS: 0.1 10*3/uL (ref 0.0–0.7)
EOS PCT: 1.7 % (ref 0.0–5.0)
HCT: 42.3 % (ref 36.0–46.0)
HEMOGLOBIN: 13.8 g/dL (ref 12.0–15.0)
Lymphocytes Relative: 19.1 % (ref 12.0–46.0)
Lymphs Abs: 1.2 10*3/uL (ref 0.7–4.0)
MCHC: 32.7 g/dL (ref 30.0–36.0)
MCV: 89 fl (ref 78.0–100.0)
Monocytes Absolute: 0.4 10*3/uL (ref 0.1–1.0)
Monocytes Relative: 6.1 % (ref 3.0–12.0)
NEUTROS ABS: 4.4 10*3/uL (ref 1.4–7.7)
NEUTROS PCT: 72.4 % (ref 43.0–77.0)
Platelets: 271 10*3/uL (ref 150.0–400.0)
RBC: 4.75 Mil/uL (ref 3.87–5.11)
RDW: 13.8 % (ref 11.5–15.5)
WBC: 6.1 10*3/uL (ref 4.0–10.5)

## 2014-03-23 LAB — TSH: TSH: 1.81 u[IU]/mL (ref 0.35–4.50)

## 2014-03-23 LAB — HEPATIC FUNCTION PANEL
ALBUMIN: 4 g/dL (ref 3.5–5.2)
ALK PHOS: 74 U/L (ref 39–117)
ALT: 15 U/L (ref 0–35)
AST: 19 U/L (ref 0–37)
BILIRUBIN DIRECT: 0.1 mg/dL (ref 0.0–0.3)
TOTAL PROTEIN: 6.8 g/dL (ref 6.0–8.3)
Total Bilirubin: 0.4 mg/dL (ref 0.2–1.2)

## 2014-03-23 LAB — MICROALBUMIN / CREATININE URINE RATIO
Creatinine,U: 12.8 mg/dL
MICROALB/CREAT RATIO: 3.1 mg/g (ref 0.0–30.0)
Microalb, Ur: 0.4 mg/dL (ref 0.0–1.9)

## 2014-03-25 ENCOUNTER — Telehealth: Payer: Self-pay | Admitting: Family Medicine

## 2014-03-25 ENCOUNTER — Encounter: Payer: Self-pay | Admitting: Family Medicine

## 2014-03-25 DIAGNOSIS — M79671 Pain in right foot: Secondary | ICD-10-CM | POA: Insufficient documentation

## 2014-03-25 NOTE — Progress Notes (Signed)
PCP and referred by: Garnet Koyanagi, DO  Subjective:   HPI: Patient is a 63 y.o. female here for right foot pain.  Patient reports since about May she has had pain, numbness right foot mainly distal 3rd and 4th digits into her toes. Currently not having any pain but the numbness has persisted. No known injury or trauma. No swelling. Iced previously. Has not tried any other treatment for this.  Past Medical History  Diagnosis Date  . Allergic rhinitis     Current Outpatient Prescriptions on File Prior to Visit  Medication Sig Dispense Refill  . aspirin 81 MG tablet Take 81 mg by mouth daily.    . Calcium-Phosphorus-Vitamin D (CALCIUM GUMMIES PO) Take 2 tablets by mouth daily.    . fluticasone (FLONASE) 50 MCG/ACT nasal spray USE 1 SPRAY INTO EACH NOSTRIL TWICE DAILY 16 g 5  . Multiple Vitamins-Minerals (MULTIVITAMIN GUMMIES WOMENS) CHEW Chew 2 tablets by mouth daily.    [name] VIVELLE-DOT 0.075 MG/24HR Place 1 patch onto the skin 2 (two) times a week.     No current facility-administered medications on file prior to visit.    Past Surgical History  Procedure Laterality Date  . Total abdominal hysterectomy w/ bilateral salpingoophorectomy    . Breast lumpectomy      fibroadenoma L breast  . Myringotomy      Left    Allergies  Allergen Reactions  . Sulfonamide Derivatives Swelling and Rash    History   Social History  . Marital Status: Married    Spouse Name: N/A    Number of Children: N/A  . 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 Smoking status: Never Smoker   . Smokeless tobacco: Never Used  . Alcohol Use: 1.2 oz/week    2 Glasses of wine per week  . Drug Use: No  . Sexual Activity:    Partners: Male   Other Topics Concern  . Not on file   Social History Narrative   Exercise-- walk almost everyday    Family History  Problem Relation Age of Onset  . Atrial fibrillation Mother    . Breast cancer Mother 15  . Thyroid disease Mother   . Cancer Mother 69    prostate  . Heart disease Mother     a fib  . Hypertension Mother   . Prostate cancer Father   . Hypertension Father   . Cancer Father 41    prostate  . Osteoarthritis  85  . Thyroid disease Brother   . Arthritis Brother   . Thyroid disease Sister   . Colon cancer Neg Hx     BP 132/84 mmHg  Pulse 59  Ht 5\' 6"  (1.676 m)  Wt 152 lb (68.947 kg)  BMI 24.55 kg/m2  Review of Systems: See HPI above.    Objective:  Physical Exam:  Gen: NAD  Right foot/ankle: Pes planus with transverse arch breakdown especially at 4th and 3rd metatarsal heads.  Some callus formation as well. FROM ankle without pain. No TTP Negative ant drawer and talar tilt.   Negative syndesmotic compression. Negative metatarsal squeeze. Thompsons test negative. NV intact distally.    Assessment & Plan:  1. 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  Not having pain at this time - placed metatarsal pads to help support transverse arch and relieve irritation of digital  nerves.  Numbness till take weeks to months to resolve.  Avoid barefoot walking, flat shoes as much as possible.  F/u prn.

## 2014-03-25 NOTE — Telephone Encounter (Signed)
Agree with notations.

## 2014-03-25 NOTE — Assessment & Plan Note (Signed)
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  Not having pain at this time - placed metatarsal pads to help support transverse arch and relieve irritation of digital nerves.  Numbness till take weeks to months to resolve.  Avoid barefoot walking, flat shoes as much as possible.  F/u prn.

## 2014-03-25 NOTE — Telephone Encounter (Signed)
Caller name: Relation to pt: Call back number: Pharmacy:  Reason for call:   Patient states that where she received the shingles inj there is a red welt that is hot to the touch. Per Gaye, patient needs to put a cool compress on her arm to call us if this does not help.

## 2014-03-30 ENCOUNTER — Encounter: Payer: Self-pay | Admitting: *Deleted

## 2014-10-12 ENCOUNTER — Other Ambulatory Visit: Payer: Self-pay

## 2014-10-12 DIAGNOSIS — Z1231 Encounter for screening mammogram for malignant neoplasm of breast: Secondary | ICD-10-CM

## 2014-11-17 ENCOUNTER — Ambulatory Visit
Admission: RE | Admit: 2014-11-17 | Discharge: 2014-11-17 | Disposition: A | Discharge status: Home / Self Care | Payer: BC Managed Care – PPO | Source: Ambulatory Visit

## 2014-11-17 DIAGNOSIS — Z1231 Encounter for screening mammogram for malignant neoplasm of breast: Secondary | ICD-10-CM

## 2015-11-03 ENCOUNTER — Other Ambulatory Visit: Payer: Self-pay | Admitting: Family Medicine

## 2015-11-03 DIAGNOSIS — Z1231 Encounter for screening mammogram for malignant neoplasm of breast: Secondary | ICD-10-CM

## 2015-11-18 ENCOUNTER — Ambulatory Visit
Admission: RE | Admit: 2015-11-18 | Discharge: 2015-11-18 | Disposition: A | Discharge status: Home / Self Care | Payer: BC Managed Care – PPO | Source: Ambulatory Visit | Attending: Family Medicine | Admitting: Family Medicine

## 2015-11-18 DIAGNOSIS — Z1231 Encounter for screening mammogram for malignant neoplasm of breast: Secondary | ICD-10-CM

## 2015-12-13 ENCOUNTER — Ambulatory Visit (INDEPENDENT_AMBULATORY_CARE_PROVIDER_SITE_OTHER): Payer: BC Managed Care – PPO | Admitting: Family Medicine

## 2015-12-13 ENCOUNTER — Encounter: Payer: Self-pay | Admitting: Family Medicine

## 2015-12-13 VITALS — BP 119/71 | HR 60 | Temp 97.9°F | Wt 152.6 lb

## 2015-12-13 DIAGNOSIS — Z0000 Encounter for general adult medical examination without abnormal findings: Secondary | ICD-10-CM

## 2015-12-13 DIAGNOSIS — Z1159 Encounter for screening for other viral diseases: Secondary | ICD-10-CM | POA: Diagnosis not present

## 2015-12-13 LAB — POCT URINALYSIS DIPSTICK
Bilirubin, UA: NEGATIVE
Blood, UA: NEGATIVE
Glucose, UA: NEGATIVE
Ketones, UA: NEGATIVE
Leukocytes, UA: NEGATIVE
Nitrite, UA: NEGATIVE
Protein, UA: NEGATIVE
Spec Grav, UA: 1.025
Urobilinogen, UA: 0.2
pH, UA: 6

## 2015-12-13 LAB — CBC WITH DIFFERENTIAL/PLATELET
BASOS PCT: 0.7 % (ref 0.0–3.0)
Basophils Absolute: 0 10*3/uL (ref 0.0–0.1)
EOS ABS: 0.1 10*3/uL (ref 0.0–0.7)
Eosinophils Relative: 1.3 % (ref 0.0–5.0)
HCT: 41.1 % (ref 36.0–46.0)
HEMOGLOBIN: 13.7 g/dL (ref 12.0–15.0)
Lymphocytes Relative: 21.6 % (ref 12.0–46.0)
Lymphs Abs: 1.3 10*3/uL (ref 0.7–4.0)
MCHC: 33.2 g/dL (ref 30.0–36.0)
MCV: 88.3 fl (ref 78.0–100.0)
Monocytes Absolute: 0.4 10*3/uL (ref 0.1–1.0)
Monocytes Relative: 6.9 % (ref 3.0–12.0)
Neutro Abs: 4 10*3/uL (ref 1.4–7.7)
Neutrophils Relative %: 69.5 % (ref 43.0–77.0)
Platelets: 257 10*3/uL (ref 150.0–400.0)
RBC: 4.66 Mil/uL (ref 3.87–5.11)
RDW: 14.1 % (ref 11.5–15.5)
WBC: 5.8 10*3/uL (ref 4.0–10.5)

## 2015-12-13 LAB — LIPID PANEL
Cholesterol: 168 mg/dL (ref 0–200)
HDL: 77.9 mg/dL
LDL Cholesterol: 76 mg/dL (ref 0–99)
NonHDL: 90.03
Total CHOL/HDL Ratio: 2
Triglycerides: 72 mg/dL (ref 0.0–149.0)
VLDL: 14.4 mg/dL (ref 0.0–40.0)

## 2015-12-13 LAB — TSH: TSH: 1.32 u[IU]/mL (ref 0.35–4.50)

## 2015-12-13 LAB — COMPREHENSIVE METABOLIC PANEL WITH GFR
ALT: 11 U/L (ref 0–35)
AST: 16 U/L (ref 0–37)
Albumin: 4.2 g/dL (ref 3.5–5.2)
Alkaline Phosphatase: 71 U/L (ref 39–117)
BUN: 9 mg/dL (ref 6–23)
CO2: 27 meq/L (ref 19–32)
Calcium: 8.6 mg/dL (ref 8.4–10.5)
Chloride: 99 meq/L (ref 96–112)
Creatinine, Ser: 0.58 mg/dL (ref 0.40–1.20)
GFR: 111.18 mL/min
Glucose, Bld: 96 mg/dL (ref 70–99)
Potassium: 3.7 meq/L (ref 3.5–5.1)
Sodium: 134 meq/L — ABNORMAL LOW (ref 135–145)
Total Bilirubin: 0.4 mg/dL (ref 0.2–1.2)
Total Protein: 7.1 g/dL (ref 6.0–8.3)

## 2015-12-13 LAB — HEPATITIS C ANTIBODY: HCV Ab: NEGATIVE

## 2015-12-13 NOTE — Progress Notes (Signed)
Subjective:     Laura Price is a 65 y.o. female and is here for a comprehensive physical exam. The patient reports no problems.  Social History   Social History  . Marital status: Married    Spouse name: N/A  . Number of children: N/A  . Years of education: N/A   Occupational History  . 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 Smoking status: Never Smoker  . Smokeless tobacco: Never Used  . Alcohol use 1.2 oz/week    2 Glasses of wine per week  . Drug use: No  . Sexual activity: Yes    Partners: Male   Other Topics Concern  . Not on file   Social History Narrative   Exercise-- walk almost everyday   Health Maintenance  Topic Date Due  . Hepatitis C Screening  1950/08/20  . HIV Screening  10/07/1966  . PAP SMEAR  12/05/2015  . INFLUENZA VACCINE  11/23/2015  . MAMMOGRAM  11/17/2017  . TETANUS/TDAP  11/21/2017  . COLONOSCOPY  11/01/2022  . ZOSTAVAX  Completed    The following portions of the patient's history were reviewed and updated as appropriate:  She  has a past medical history of Allergic rhinitis. She  does not have any pertinent problems on file. She  has a past surgical history that includes Total abdominal hysterectomy w/ bilateral salpingoophorectomy; Breast lumpectomy; and Myringotomy. Her family history includes Arthritis in her brother; Atrial fibrillation in her mother; Breast cancer (age of onset: 22) in her mother; Cancer (age of onset: 64) in her father and mother; Heart disease in her mother; Hypertension in her father and mother; Prostate cancer in her father; Thyroid disease in her brother, mother, and sister. She  reports that she has never smoked. She has never used smokeless tobacco. She reports that she drinks about 1.2 oz of alcohol per week . She reports that she does not use drugs. She has a current medication list which includes the following prescription(s): aspirin, calcium  carb-cholecalciferol, estradiol, fluticasone, and multivitamin gummies womens. Current Outpatient Prescriptions on File Prior to Visit  Medication Sig Dispense Refill  . aspirin 81 MG tablet Take 81 mg by mouth daily.    . fluticasone (FLONASE) 50 MCG/ACT nasal spray USE 1 SPRAY INTO EACH NOSTRIL TWICE DAILY 16 g 5  . Multiple Vitamins-Minerals (MULTIVITAMIN GUMMIES WOMENS) CHEW Chew 2 tablets by mouth daily.     No current facility-administered medications on file prior to visit.    She is allergic to sulfonamide derivatives..  Review of Systems Review of Systems  Constitutional: Negative for activity change, appetite change and fatigue.  HENT: Negative for hearing loss, congestion, tinnitus and ear discharge.  dentist q38m Eyes: Negative for visual disturbance (see optho q1y -- vision corrected to 20/20 with glasses).  Respiratory: Negative for cough, chest tightness and shortness of breath.   Cardiovascular: Negative for chest pain, palpitations and leg swelling.  Gastrointestinal: Negative for abdominal pain, diarrhea, constipation and abdominal distention.  Genitourinary: Negative for urgency, frequency, decreased urine volume and difficulty urinating.  Musculoskeletal: Negative for back pain, arthralgias and gait problem.  Skin: Negative for color change, pallor and rash.  Neurological: Negative for dizziness, light-headedness, numbness and headaches.  Hematological: Negative for adenopathy. Does not bruise/bleed easily.  Psychiatric/Behavioral: Negative for suicidal ideas, confusion, sleep disturbance, self-injury, dysphoric mood, decreased concentration and agitation.       Objective:    BP 119/71 (  BP Location: Left Arm, Patient Position: Sitting, Cuff Size: Normal)   Pulse 60   Temp 97.9 F (36.6 C) (Oral)   Wt 152 lb 9.6 oz (69.2 kg)   SpO2 97%   BMI 24.63 kg/m  General appearance: alert, cooperative, appears stated age and no distress Head: Normocephalic, without  obvious abnormality, atraumatic Eyes: opth -- q2y -- - beg of cataracts Ears: normal TM's and external ear canals both ears Nose: Nares normal. Septum midline. Mucosa normal. No drainage or sinus tenderness. Throat: lips, mucosa, and tongue normal; teeth and gums normal Neck: no adenopathy, no carotid bruit, no JVD, supple, symmetrical, trachea midline and thyroid not enlarged, symmetric, no tenderness/mass/nodules Back: symmetric, no curvature. ROM normal. No CVA tenderness. Lungs: clear to auscultation bilaterally Breasts: gyn Heart: regular rate and rhythm, S1, S2 normal, no murmur, click, rub or gallop Abdomen: soft, non-tender; bowel sounds normal; no masses,  no organomegaly Pelvic: deferred-gyn Extremities: extremities normal, atraumatic, no cyanosis or edema Pulses: 2+ and symmetric Skin: Skin color, texture, turgor normal. No rashes or lesions Lymph nodes: Cervical, supraclavicular, and axillary nodes normal. Neurologic: Alert and oriented X 3, normal strength and tone. Normal symmetric reflexes. Normal coordination and gait    Assessment:    Healthy female exam.      Plan:    ghm utd Check labs See After Visit Summary for Counseling Recommendations    1. Preventative health care See above - Comprehensive metabolic panel - Lipid panel - CBC with Differential/Platelet - POCT urinalysis dipstick - TSH - Hepatitis C antibody  2. Need for hepatitis C screening test   - Hepatitis C antibody

## 2015-12-13 NOTE — Patient Instructions (Signed)
Preventive Care for Adults, Female A healthy lifestyle and preventive care can promote health and wellness. Preventive health guidelines for women include the following key practices.  A routine yearly physical is a good way to check with your health care provider about your health and preventive screening. It is a chance to share any concerns and updates on your health and to receive a thorough exam.  Visit your dentist for a routine exam and preventive care every 6 months. Brush your teeth twice a day and floss once a day. Good oral hygiene prevents tooth decay and gum disease.  The frequency of eye exams is based on your age, health, family medical history, use of contact lenses, and other factors. Follow your health care provider's recommendations for frequency of eye exams.  Eat a healthy diet. Foods like vegetables, fruits, whole grains, low-fat dairy products, and lean protein foods contain the nutrients you need without too many calories. Decrease your intake of foods high in solid fats, added sugars, and salt. Eat the right amount of calories for you.Get information about a proper diet from your health care provider, if necessary.  Regular physical exercise is one of the most important things you can do for your health. Most adults should get at least 150 minutes of moderate-intensity exercise (any activity that increases your heart rate and causes you to sweat) each week. In addition, most adults need muscle-strengthening exercises on 2 or more days a week.  Maintain a healthy weight. The body mass index (BMI) is a screening tool to identify possible weight problems. It provides an estimate of body fat based on height and weight. Your health care provider can find your BMI and can help you achieve or maintain a healthy weight.For adults 20 years and older:  A BMI below 18.5 is considered underweight.  A BMI of 18.5 to 24.9 is normal.  A BMI of 25 to 29.9 is considered overweight.  A  BMI of 30 and above is considered obese.  Maintain normal blood lipids and cholesterol levels by exercising and minimizing your intake of saturated fat. Eat a balanced diet with plenty of fruit and vegetables. Blood tests for lipids and cholesterol should begin at age 45 and be repeated every 5 years. If your lipid or cholesterol levels are high, you are over 50, or you are at high risk for heart disease, you may need your cholesterol levels checked more frequently.Ongoing high lipid and cholesterol levels should be treated with medicines if diet and exercise are not working.  If you smoke, find out from your health care provider how to quit. If you do not use tobacco, do not start.  Lung cancer screening is recommended for adults aged 45-80 years who are at high risk for developing lung cancer because of a history of smoking. A yearly low-dose CT scan of the lungs is recommended for people who have at least a 30-pack-year history of smoking and are a current smoker or have quit within the past 15 years. A pack year of smoking is smoking an average of 1 pack of cigarettes a day for 1 year (for example: 1 pack a day for 30 years or 2 packs a day for 15 years). Yearly screening should continue until the smoker has stopped smoking for at least 15 years. Yearly screening should be stopped for people who develop a health problem that would prevent them from having lung cancer treatment.  If you are pregnant, do not drink alcohol. If you are  breastfeeding, be very cautious about drinking alcohol. If you are not pregnant and choose to drink alcohol, do not have more than 1 drink per day. One drink is considered to be 12 ounces (355 mL) of beer, 5 ounces (148 mL) of wine, or 1.5 ounces (44 mL) of liquor.  Avoid use of street drugs. Do not share needles with anyone. Ask for help if you need support or instructions about stopping the use of drugs.  High blood pressure causes heart disease and increases the risk  of stroke. Your blood pressure should be checked at least every 1 to 2 years. Ongoing high blood pressure should be treated with medicines if weight loss and exercise do not work.  If you are 55-79 years old, ask your health care provider if you should take aspirin to prevent strokes.  Diabetes screening is done by taking a blood sample to check your blood glucose level after you have not eaten for a certain period of time (fasting). If you are not overweight and you do not have risk factors for diabetes, you should be screened once every 3 years starting at age 45. If you are overweight or obese and you are 40-70 years of age, you should be screened for diabetes every year as part of your cardiovascular risk assessment.  Breast cancer screening is essential preventive care for women. You should practice "breast self-awareness." This means understanding the normal appearance and feel of your breasts and may include breast self-examination. Any changes detected, no matter how small, should be reported to a health care provider. Women in their 20s and 30s should have a clinical breast exam (CBE) by a health care provider as part of a regular health exam every 1 to 3 years. After age 40, women should have a CBE every year. Starting at age 40, women should consider having a mammogram (breast X-ray test) every year. Women who have a family history of breast cancer should talk to their health care provider about genetic screening. Women at a high risk of breast cancer should talk to their health care providers about having an MRI and a mammogram every year.  Breast cancer gene (BRCA)-related cancer risk assessment is recommended for women who have family members with BRCA-related cancers. BRCA-related cancers include breast, ovarian, tubal, and peritoneal cancers. Having family members with these cancers may be associated with an increased risk for harmful changes (mutations) in the breast cancer genes BRCA1 and  BRCA2. Results of the assessment will determine the need for genetic counseling and BRCA1 and BRCA2 testing.  Your health care provider may recommend that you be screened regularly for cancer of the pelvic organs (ovaries, uterus, and vagina). This screening involves a pelvic examination, including checking for microscopic changes to the surface of your cervix (Pap test). You may be encouraged to have this screening done every 3 years, beginning at age 21.  For women ages 30-65, health care providers may recommend pelvic exams and Pap testing every 3 years, or they may recommend the Pap and pelvic exam, combined with testing for human papilloma virus (HPV), every 5 years. Some types of HPV increase your risk of cervical cancer. Testing for HPV may also be done on women of any age with unclear Pap test results.  Other health care providers may not recommend any screening for nonpregnant women who are considered low risk for pelvic cancer and who do not have symptoms. Ask your health care provider if a screening pelvic exam is right for   you.  If you have had past treatment for cervical cancer or a condition that could lead to cancer, you need Pap tests and screening for cancer for at least 20 years after your treatment. If Pap tests have been discontinued, your risk factors (such as having a new sexual partner) need to be reassessed to determine if screening should resume. Some women have medical problems that increase the chance of getting cervical cancer. In these cases, your health care provider may recommend more frequent screening and Pap tests.  Colorectal cancer can be detected and often prevented. Most routine colorectal cancer screening begins at the age of 50 years and continues through age 75 years. However, your health care provider may recommend screening at an earlier age if you have risk factors for colon cancer. On a yearly basis, your health care provider may provide home test kits to check  for hidden blood in the stool. Use of a small camera at the end of a tube, to directly examine the colon (sigmoidoscopy or colonoscopy), can detect the earliest forms of colorectal cancer. Talk to your health care provider about this at age 50, when routine screening begins. Direct exam of the colon should be repeated every 5-10 years through age 75 years, unless early forms of precancerous polyps or small growths are found.  People who are at an increased risk for hepatitis B should be screened for this virus. You are considered at high risk for hepatitis B if:  You were born in a country where hepatitis B occurs often. Talk with your health care provider about which countries are considered high risk.  Your parents were born in a high-risk country and you have not received a shot to protect against hepatitis B (hepatitis B vaccine).  You have HIV or AIDS.  You use needles to inject street drugs.  You live with, or have sex with, someone who has hepatitis B.  You get hemodialysis treatment.  You take certain medicines for conditions like cancer, organ transplantation, and autoimmune conditions.  Hepatitis C blood testing is recommended for all people born from 1945 through 1965 and any individual with known risks for hepatitis C.  Practice safe sex. Use condoms and avoid high-risk sexual practices to reduce the spread of sexually transmitted infections (STIs). STIs include gonorrhea, chlamydia, syphilis, trichomonas, herpes, HPV, and human immunodeficiency virus (HIV). Herpes, HIV, and HPV are viral illnesses that have no cure. They can result in disability, cancer, and death.  You should be screened for sexually transmitted illnesses (STIs) including gonorrhea and chlamydia if:  You are sexually active and are younger than 24 years.  You are older than 24 years and your health care provider tells you that you are at risk for this type of infection.  Your sexual activity has changed  since you were last screened and you are at an increased risk for chlamydia or gonorrhea. Ask your health care provider if you are at risk.  If you are at risk of being infected with HIV, it is recommended that you take a prescription medicine daily to prevent HIV infection. This is called preexposure prophylaxis (PrEP). You are considered at risk if:  You are sexually active and do not regularly use condoms or know the HIV status of your partner(s).  You take drugs by injection.  You are sexually active with a partner who has HIV.  Talk with your health care provider about whether you are at high risk of being infected with HIV. If   you choose to begin PrEP, you should first be tested for HIV. You should then be tested every 3 months for as long as you are taking PrEP.  Osteoporosis is a disease in which the bones lose minerals and strength with aging. This can result in serious bone fractures or breaks. The risk of osteoporosis can be identified using a bone density scan. Women ages 67 years and over and women at risk for fractures or osteoporosis should discuss screening with their health care providers. Ask your health care provider whether you should take a calcium supplement or vitamin D to reduce the rate of osteoporosis.  Menopause can be associated with physical symptoms and risks. Hormone replacement therapy is available to decrease symptoms and risks. You should talk to your health care provider about whether hormone replacement therapy is right for you.  Use sunscreen. Apply sunscreen liberally and repeatedly throughout the day. You should seek shade when your shadow is shorter than you. Protect yourself by wearing long sleeves, pants, a wide-brimmed hat, and sunglasses year round, whenever you are outdoors.  Once a month, do a whole body skin exam, using a mirror to look at the skin on your back. Tell your health care provider of new moles, moles that have irregular borders, moles that  are larger than a pencil eraser, or moles that have changed in shape or color.  Stay current with required vaccines (immunizations).  Influenza vaccine. All adults should be immunized every year.  Tetanus, diphtheria, and acellular pertussis (Td, Tdap) vaccine. Pregnant women should receive 1 dose of Tdap vaccine during each pregnancy. The dose should be obtained regardless of the length of time since the last dose. Immunization is preferred during the 27th-36th week of gestation. An adult who has not previously received Tdap or who does not know her vaccine status should receive 1 dose of Tdap. This initial dose should be followed by tetanus and diphtheria toxoids (Td) booster doses every 10 years. Adults with an unknown or incomplete history of completing a 3-dose immunization series with Td-containing vaccines should begin or complete a primary immunization series including a Tdap dose. Adults should receive a Td booster every 10 years.  Varicella vaccine. An adult without evidence of immunity to varicella should receive 2 doses or a second dose if she has previously received 1 dose. Pregnant females who do not have evidence of immunity should receive the first dose after pregnancy. This first dose should be obtained before leaving the health care facility. The second dose should be obtained 4-8 weeks after the first dose.  Human papillomavirus (HPV) vaccine. Females aged 13-26 years who have not received the vaccine previously should obtain the 3-dose series. The vaccine is not recommended for use in pregnant females. However, pregnancy testing is not needed before receiving a dose. If a female is found to be pregnant after receiving a dose, no treatment is needed. In that case, the remaining doses should be delayed until after the pregnancy. Immunization is recommended for any person with an immunocompromised condition through the age of 61 years if she did not get any or all doses earlier. During the  3-dose series, the second dose should be obtained 4-8 weeks after the first dose. The third dose should be obtained 24 weeks after the first dose and 16 weeks after the second dose.  Zoster vaccine. One dose is recommended for adults aged 30 years or older unless certain conditions are present.  Measles, mumps, and rubella (MMR) vaccine. Adults born  before 1957 generally are considered immune to measles and mumps. Adults born in 1957 or later should have 1 or more doses of MMR vaccine unless there is a contraindication to the vaccine or there is laboratory evidence of immunity to each of the three diseases. A routine second dose of MMR vaccine should be obtained at least 28 days after the first dose for students attending postsecondary schools, health care workers, or international travelers. People who received inactivated measles vaccine or an unknown type of measles vaccine during 1963-1967 should receive 2 doses of MMR vaccine. People who received inactivated mumps vaccine or an unknown type of mumps vaccine before 1979 and are at high risk for mumps infection should consider immunization with 2 doses of MMR vaccine. For females of childbearing age, rubella immunity should be determined. If there is no evidence of immunity, females who are not pregnant should be vaccinated. If there is no evidence of immunity, females who are pregnant should delay immunization until after pregnancy. Unvaccinated health care workers born before 1957 who lack laboratory evidence of measles, mumps, or rubella immunity or laboratory confirmation of disease should consider measles and mumps immunization with 2 doses of MMR vaccine or rubella immunization with 1 dose of MMR vaccine.  Pneumococcal 13-valent conjugate (PCV13) vaccine. When indicated, a person who is uncertain of his immunization history and has no record of immunization should receive the PCV13 vaccine. All adults 65 years of age and older should receive this  vaccine. An adult aged 19 years or older who has certain medical conditions and has not been previously immunized should receive 1 dose of PCV13 vaccine. This PCV13 should be followed with a dose of pneumococcal polysaccharide (PPSV23) vaccine. Adults who are at high risk for pneumococcal disease should obtain the PPSV23 vaccine at least 8 weeks after the dose of PCV13 vaccine. Adults older than 65 years of age who have normal immune system function should obtain the PPSV23 vaccine dose at least 1 year after the dose of PCV13 vaccine.  Pneumococcal polysaccharide (PPSV23) vaccine. When PCV13 is also indicated, PCV13 should be obtained first. All adults aged 65 years and older should be immunized. An adult younger than age 65 years who has certain medical conditions should be immunized. Any person who resides in a nursing home or long-term care facility should be immunized. An adult smoker should be immunized. People with an immunocompromised condition and certain other conditions should receive both PCV13 and PPSV23 vaccines. People with human immunodeficiency virus (HIV) infection should be immunized as soon as possible after diagnosis. Immunization during chemotherapy or radiation therapy should be avoided. Routine use of PPSV23 vaccine is not recommended for American Indians, [place] Natives, or people younger than 65 years unless there are medical conditions that require PPSV23 vaccine. When indicated, people who have unknown immunization and have no record of immunization should receive PPSV23 vaccine. One-time revaccination 5 years after the first dose of PPSV23 is recommended for people aged 19-64 years who have chronic kidney failure, nephrotic syndrome, asplenia, or immunocompromised conditions. People who received 1-2 doses of PPSV23 before age 65 years should receive another dose of PPSV23 vaccine at age 65 years or later if at least 5 years have passed since the previous dose. Doses of PPSV23 are not  needed for people immunized with PPSV23 at or after age 65 years.  Meningococcal vaccine. Adults with asplenia or persistent complement component deficiencies should receive 2 doses of quadrivalent meningococcal conjugate (MenACWY-D) vaccine. The doses should be obtained   at least 2 months apart. Microbiologists working with certain meningococcal bacteria, [place] recruits, people at risk during an outbreak, and people who travel to or live in countries with a high rate of meningitis should be immunized. A first-year college student up through age 34 years who is living in a residence hall should receive a dose if she did not receive a dose on or after her 16th birthday. Adults who have certain high-risk conditions should receive one or more doses of vaccine.  Hepatitis A vaccine. Adults who wish to be protected from this disease, have certain high-risk conditions, work with hepatitis A-infected animals, work in hepatitis A research labs, or travel to or work in countries with a high rate of hepatitis A should be immunized. Adults who were previously unvaccinated and who anticipate close contact with an international adoptee during the first 60 days after arrival in the [place] from a country with a high rate of hepatitis A should be immunized.  Hepatitis B vaccine. Adults who wish to be protected from this disease, have certain high-risk conditions, may be exposed to blood or other infectious body fluids, are household contacts or sex partners of hepatitis B positive people, are clients or workers in certain care facilities, or travel to or work in countries with a high rate of hepatitis B should be immunized.  Haemophilus influenzae type b (Hib) vaccine. A previously unvaccinated person with asplenia or sickle cell disease or having a scheduled splenectomy should receive 1 dose of Hib vaccine. Regardless of previous immunization, a recipient of a hematopoietic stem cell transplant should receive a  3-dose series 6-12 months after her successful transplant. Hib vaccine is not recommended for adults with HIV infection. Preventive Services / Frequency Ages 35 to 4 years  Blood pressure check.** / Every 3-5 years.  Lipid and cholesterol check.** / Every 5 years beginning at age 60.  Clinical breast exam.** / Every 3 years for women in their 71s and 10s.  BRCA-related cancer risk assessment.** / For women who have family members with a BRCA-related cancer (breast, ovarian, tubal, or peritoneal cancers).  Pap test.** / Every 2 years from ages 76 through 26. Every 3 years starting at age 61 through age 76 or 93 with a history of 3 consecutive normal Pap tests.  HPV screening.** / Every 3 years from ages 37 through ages 60 to 51 with a history of 3 consecutive normal Pap tests.  Hepatitis C blood test.** / For any individual with known risks for hepatitis C.  Skin self-exam. / Monthly.  Influenza vaccine. / Every year.  Tetanus, diphtheria, and acellular pertussis (Tdap, Td) vaccine.** / Consult your health care provider. Pregnant women should receive 1 dose of Tdap vaccine during each pregnancy. 1 dose of Td every 10 years.  Varicella vaccine.** / Consult your health care provider. Pregnant females who do not have evidence of immunity should receive the first dose after pregnancy.  HPV vaccine. / 3 doses over 6 months, if 93 and younger. The vaccine is not recommended for use in pregnant females. However, pregnancy testing is not needed before receiving a dose.  Measles, mumps, rubella (MMR) vaccine.** / You need at least 1 dose of MMR if you were born in 1957 or later. You may also need a 2nd dose. For females of childbearing age, rubella immunity should be determined. If there is no evidence of immunity, females who are not pregnant should be vaccinated. If there is no evidence of immunity, females who are  pregnant should delay immunization until after pregnancy.  Pneumococcal  13-valent conjugate (PCV13) vaccine.** / Consult your health care provider.  Pneumococcal polysaccharide (PPSV23) vaccine.** / 1 to 2 doses if you smoke cigarettes or if you have certain conditions.  Meningococcal vaccine.** / 1 dose if you are age 68 to 8 years and a Market researcher living in a residence hall, or have one of several medical conditions, you need to get vaccinated against meningococcal disease. You may also need additional booster doses.  Hepatitis A vaccine.** / Consult your health care provider.  Hepatitis B vaccine.** / Consult your health care provider.  Haemophilus influenzae type b (Hib) vaccine.** / Consult your health care provider. Ages 7 to 53 years  Blood pressure check.** / Every year.  Lipid and cholesterol check.** / Every 5 years beginning at age 25 years.  Lung cancer screening. / Every year if you are aged 11-80 years and have a 30-pack-year history of smoking and currently smoke or have quit within the past 15 years. Yearly screening is stopped once you have quit smoking for at least 15 years or develop a health problem that would prevent you from having lung cancer treatment.  Clinical breast exam.** / Every year after age 48 years.  BRCA-related cancer risk assessment.** / For women who have family members with a BRCA-related cancer (breast, ovarian, tubal, or peritoneal cancers).  Mammogram.** / Every year beginning at age 41 years and continuing for as long as you are in good health. Consult with your health care provider.  Pap test.** / Every 3 years starting at age 65 years through age 37 or 70 years with a history of 3 consecutive normal Pap tests.  HPV screening.** / Every 3 years from ages 72 years through ages 60 to 40 years with a history of 3 consecutive normal Pap tests.  Fecal occult blood test (FOBT) of stool. / Every year beginning at age 21 years and continuing until age 5 years. You may not need to do this test if you get  a colonoscopy every 10 years.  Flexible sigmoidoscopy or colonoscopy.** / Every 5 years for a flexible sigmoidoscopy or every 10 years for a colonoscopy beginning at age 35 years and continuing until age 48 years.  Hepatitis C blood test.** / For all people born from [birth year] through 1965 and any individual with known risks for hepatitis C.  Skin self-exam. / Monthly.  Influenza vaccine. / Every year.  Tetanus, diphtheria, and acellular pertussis (Tdap/Td) vaccine.** / Consult your health care provider. Pregnant women should receive 1 dose of Tdap vaccine during each pregnancy. 1 dose of Td every 10 years.  Varicella vaccine.** / Consult your health care provider. Pregnant females who do not have evidence of immunity should receive the first dose after pregnancy.  Zoster vaccine.** / 1 dose for adults aged 30 years or older.  Measles, mumps, rubella (MMR) vaccine.** / You need at least 1 dose of MMR if you were born in 1957 or later. You may also need a second dose. For females of childbearing age, rubella immunity should be determined. If there is no evidence of immunity, females who are not pregnant should be vaccinated. If there is no evidence of immunity, females who are pregnant should delay immunization until after pregnancy.  Pneumococcal 13-valent conjugate (PCV13) vaccine.** / Consult your health care provider.  Pneumococcal polysaccharide (PPSV23) vaccine.** / 1 to 2 doses if you smoke cigarettes or if you have certain conditions.  Meningococcal vaccine.** /  Consult your health care provider.  Hepatitis A vaccine.** / Consult your health care provider.  Hepatitis B vaccine.** / Consult your health care provider.  Haemophilus influenzae type b (Hib) vaccine.** / Consult your health care provider. Ages 64 years and over  Blood pressure check.** / Every year.  Lipid and cholesterol check.** / Every 5 years beginning at age 23 years.  Lung cancer screening. / Every year if you  are aged 16-80 years and have a 30-pack-year history of smoking and currently smoke or have quit within the past 15 years. Yearly screening is stopped once you have quit smoking for at least 15 years or develop a health problem that would prevent you from having lung cancer treatment.  Clinical breast exam.** / Every year after age 74 years.  BRCA-related cancer risk assessment.** / For women who have family members with a BRCA-related cancer (breast, ovarian, tubal, or peritoneal cancers).  Mammogram.** / Every year beginning at age 44 years and continuing for as long as you are in good health. Consult with your health care provider.  Pap test.** / Every 3 years starting at age 58 years through age 22 or 39 years with 3 consecutive normal Pap tests. Testing can be stopped between 65 and 70 years with 3 consecutive normal Pap tests and no abnormal Pap or HPV tests in the past 10 years.  HPV screening.** / Every 3 years from ages 64 years through ages 70 or 61 years with a history of 3 consecutive normal Pap tests. Testing can be stopped between 65 and 70 years with 3 consecutive normal Pap tests and no abnormal Pap or HPV tests in the past 10 years.  Fecal occult blood test (FOBT) of stool. / Every year beginning at age 40 years and continuing until age 27 years. You may not need to do this test if you get a colonoscopy every 10 years.  Flexible sigmoidoscopy or colonoscopy.** / Every 5 years for a flexible sigmoidoscopy or every 10 years for a colonoscopy beginning at age 7 years and continuing until age 32 years.  Hepatitis C blood test.** / For all people born from [birth year] through 1965 and any individual with known risks for hepatitis C.  Osteoporosis screening.** / A one-time screening for women ages 30 years and over and women at risk for fractures or osteoporosis.  Skin self-exam. / Monthly.  Influenza vaccine. / Every year.  Tetanus, diphtheria, and acellular pertussis (Tdap/Td)  vaccine.** / 1 dose of Td every 10 years.  Varicella vaccine.** / Consult your health care provider.  Zoster vaccine.** / 1 dose for adults aged 35 years or older.  Pneumococcal 13-valent conjugate (PCV13) vaccine.** / Consult your health care provider.  Pneumococcal polysaccharide (PPSV23) vaccine.** / 1 dose for all adults aged 46 years and older.  Meningococcal vaccine.** / Consult your health care provider.  Hepatitis A vaccine.** / Consult your health care provider.  Hepatitis B vaccine.** / Consult your health care provider.  Haemophilus influenzae type b (Hib) vaccine.** / Consult your health care provider. ** Family history and personal history of risk and conditions may change your health care provider's recommendations.   This information is not intended to replace advice given to you by your health care provider. Make sure you discuss any questions you have with your health care provider.   Document Released: 06/06/2001 Document Revised: 05/01/2014 Document Reviewed: 09/05/2010 Elsevier Interactive Patient Education [employer].

## 2016-11-07 ENCOUNTER — Other Ambulatory Visit: Payer: Self-pay | Admitting: Family Medicine

## 2016-11-07 DIAGNOSIS — Z1231 Encounter for screening mammogram for malignant neoplasm of breast: Secondary | ICD-10-CM

## 2016-11-22 ENCOUNTER — Ambulatory Visit: Payer: BC Managed Care – PPO

## 2016-11-30 ENCOUNTER — Ambulatory Visit
Admission: RE | Admit: 2016-11-30 | Discharge: 2016-11-30 | Disposition: A | Discharge status: Home / Self Care | Payer: PPO | Source: Ambulatory Visit | Attending: Family Medicine | Admitting: Family Medicine

## 2016-11-30 DIAGNOSIS — Z1231 Encounter for screening mammogram for malignant neoplasm of breast: Secondary | ICD-10-CM | POA: Diagnosis not present

## 2017-01-24 DIAGNOSIS — H524 Presbyopia: Secondary | ICD-10-CM | POA: Diagnosis not present

## 2017-01-25 DIAGNOSIS — Z6824 Body mass index (BMI) 24.0-24.9, adult: Secondary | ICD-10-CM | POA: Diagnosis not present

## 2017-01-25 DIAGNOSIS — Z01419 Encounter for gynecological examination (general) (routine) without abnormal findings: Secondary | ICD-10-CM | POA: Diagnosis not present

## 2017-02-01 ENCOUNTER — Encounter: Payer: Self-pay | Admitting: Family Medicine

## 2017-02-01 ENCOUNTER — Ambulatory Visit (INDEPENDENT_AMBULATORY_CARE_PROVIDER_SITE_OTHER): Payer: PPO | Admitting: Family Medicine

## 2017-02-01 VITALS — BP 158/86 | HR 61 | Temp 98.1°F | Resp 16 | Ht 65.0 in | Wt 152.0 lb

## 2017-02-01 DIAGNOSIS — M85852 Other specified disorders of bone density and structure, left thigh: Secondary | ICD-10-CM | POA: Diagnosis not present

## 2017-02-01 DIAGNOSIS — M85851 Other specified disorders of bone density and structure, right thigh: Secondary | ICD-10-CM

## 2017-02-01 DIAGNOSIS — Z23 Encounter for immunization: Secondary | ICD-10-CM

## 2017-02-01 DIAGNOSIS — Z0000 Encounter for general adult medical examination without abnormal findings: Secondary | ICD-10-CM

## 2017-02-01 LAB — POC URINALSYSI DIPSTICK (AUTOMATED)
Bilirubin, UA: NEGATIVE
Blood, UA: NEGATIVE
Glucose, UA: NEGATIVE
Ketones, UA: NEGATIVE
Leukocytes, UA: NEGATIVE
Nitrite, UA: NEGATIVE
Protein, UA: NEGATIVE
Spec Grav, UA: 1.01 (ref 1.010–1.025)
Urobilinogen, UA: 0.2 E.U./dL
pH, UA: 6 (ref 5.0–8.0)

## 2017-02-01 NOTE — Patient Instructions (Signed)
Preventive Care 65 Years and Older, Female Preventive care refers to lifestyle choices and visits with your health care provider that can promote health and wellness. What does preventive care include?  A yearly physical exam. This is also called an annual well check.  Dental exams once or twice a year.  Routine eye exams. Ask your health care provider how often you should have your eyes checked.  Personal lifestyle choices, including: ? Daily care of your teeth and gums. ? Regular physical activity. ? Eating a healthy diet. ? Avoiding tobacco and drug use. ? Limiting alcohol use. ? Practicing safe sex. ? Taking low-dose aspirin every day. ? Taking vitamin and mineral supplements as recommended by your health care provider. What happens during an annual well check? The services and screenings done by your health care provider during your annual well check will depend on your age, overall health, lifestyle risk factors, and family history of disease. Counseling Your health care provider may ask you questions about your:  Alcohol use.  Tobacco use.  Drug use.  Emotional well-being.  Home and relationship well-being.  Sexual activity.  Eating habits.  History of falls.  Memory and ability to understand (cognition).  Work and work environment.  Reproductive health.  Screening You may have the following tests or measurements:  Height, weight, and BMI.  Blood pressure.  Lipid and cholesterol levels. These may be checked every 5 years, or more frequently if you are over 50 years old.  Skin check.  Lung cancer screening. You may have this screening every year starting at age 55 if you have a 30-pack-year history of smoking and currently smoke or have quit within the past 15 years.  Fecal occult blood test (FOBT) of the stool. You may have this test every year starting at age 50.  Flexible sigmoidoscopy or colonoscopy. You may have a sigmoidoscopy every 5 years or  a colonoscopy every 10 years starting at age 50.  Hepatitis C blood test.  Hepatitis B blood test.  Sexually transmitted disease (STD) testing.  Diabetes screening. This is done by checking your blood sugar (glucose) after you have not eaten for a while (fasting). You may have this done every 1-3 years.  Bone density scan. This is done to screen for osteoporosis. You may have this done starting at age 65.  Mammogram. This may be done every 1-2 years. Talk to your health care provider about how often you should have regular mammograms.  Talk with your health care provider about your test results, treatment options, and if necessary, the need for more tests. Vaccines Your health care provider may recommend certain vaccines, such as:  Influenza vaccine. This is recommended every year.  Tetanus, diphtheria, and acellular pertussis (Tdap, Td) vaccine. You may need a Td booster every 10 years.  Varicella vaccine. You may need this if you have not been vaccinated.  Zoster vaccine. You may need this after age 60.  Measles, mumps, and rubella (MMR) vaccine. You may need at least one dose of MMR if you were born in 1957 or later. You may also need a second dose.  Pneumococcal 13-valent conjugate (PCV13) vaccine. One dose is recommended after age 65.  Pneumococcal polysaccharide (PPSV23) vaccine. One dose is recommended after age 65.  Meningococcal vaccine. You may need this if you have certain conditions.  Hepatitis A vaccine. You may need this if you have certain conditions or if you travel or work in places where you may be exposed to hepatitis   A.  Hepatitis B vaccine. You may need this if you have certain conditions or if you travel or work in places where you may be exposed to hepatitis B.  Haemophilus influenzae type b (Hib) vaccine. You may need this if you have certain conditions.  Talk to your health care provider about which screenings and vaccines you need and how often you  need them. This information is not intended to replace advice given to you by your health care provider. Make sure you discuss any questions you have with your health care provider. Document Released: 05/07/2015 Document Revised: 12/29/2015 Document Reviewed: 02/09/2015 Elsevier Interactive Patient Education  2017 [employer].

## 2017-02-01 NOTE — Progress Notes (Signed)
Subjective:    Laura Price is a 66 y.o. female who presents for a Welcome to Medicare exam.   Review of Systems  Review of Systems  Constitutional: Negative for activity change, appetite change and fatigue.  HENT: Negative for hearing loss, congestion, tinnitus and ear discharge.   Eyes: Negative for visual disturbance (see optho q1y -- vision corrected to 20/20 with glasses).  Respiratory: Negative for cough, chest tightness and shortness of breath.   Cardiovascular: Negative for chest pain, palpitations and leg swelling.  Gastrointestinal: Negative for abdominal pain, diarrhea, constipation and abdominal distention.  Genitourinary: Negative for urgency, frequency, decreased urine volume and difficulty urinating.  Musculoskeletal: Negative for back pain, arthralgias and gait problem.  Skin: Negative for color change, pallor and rash.  Neurological: Negative for dizziness, light-headedness, numbness and headaches.  Hematological: Negative for adenopathy. Does not bruise/bleed easily.  Psychiatric/Behavioral: Negative for suicidal ideas, confusion, sleep disturbance, self-injury, dysphoric mood, decreased concentration and agitation.  Pt is able to read and write and can do all ADLs No risk for falling No abuse/ violence in home          Objective:    Today's Vitals   02/01/17 1524 02/01/17 1627  BP: (!) 170/70 (!) 158/86  Pulse: 61   Resp: 16   Temp: 98.1 F (36.7 C)   TempSrc: Oral   SpO2: 99%   Weight: 152 lb (68.9 kg)   Height: 5\' 5"  (1.651 m)   Body mass index is 25.29 kg/m.  Medications Outpatient Encounter Prescriptions as of 02/01/2017  Medication Sig  . aspirin 81 MG tablet Take 81 mg by mouth daily.  . Calcium Carb-Cholecalciferol (CALTRATE 600+D3 SOFT) 600-800 MG-UNIT CHEW Chew 1 tablet by mouth daily.  [name] estradiol (VIVELLE-DOT) 0.05 MG/24HR patch Place 1 patch onto the skin 2 (two) times a week.  . fluticasone (FLONASE) 50 MCG/ACT nasal spray  USE 1 SPRAY INTO EACH NOSTRIL TWICE DAILY  . Multiple Vitamins-Minerals (MULTIVITAMIN GUMMIES WOMENS) CHEW Chew 2 tablets by mouth daily.   No facility-administered encounter medications on file as of 02/01/2017.      History: Past Medical History:  Diagnosis Date  . Allergic rhinitis    Past Surgical History:  Procedure Laterality Date  . BREAST LUMPECTOMY     fibroadenoma L breast  . MYRINGOTOMY     Left  . TOTAL ABDOMINAL HYSTERECTOMY W/ BILATERAL SALPINGOOPHORECTOMY      Family History  Problem Relation Age of Onset  . Atrial fibrillation Mother   . Breast cancer Mother 49  . Thyroid disease Mother   . Cancer Mother 83       prostate  . Heart disease Mother        a fib  . Hypertension Mother   . Prostate cancer Father   . Hypertension Father   . Cancer Father 5       prostate  . Thyroid disease Brother   . Arthritis Brother   . Thyroid disease Sister   . Osteoarthritis Unknown 85  . Colon cancer Neg Hx    Social History   Occupational History  . [employer]    PT   Social History Main Topics  . Smoking status: Never Smoker  . Smokeless tobacco: Never Used  . Alcohol use 1.2 oz/week    2 Glasses of wine per week  . Drug use: No  . Sexual activity: Yes    Partners: Male    Tobacco Counseling Counseling given:  Not Answered   Immunizations and Health Maintenance Immunization History  Administered Date(s) Administered  . Influenza Whole 01/26/2009, 02/19/2012  . Influenza,inj,Quad PF,6+ Mos 02/18/2013, 01/30/2014, 02/01/2017  . Influenza-Unspecified 02/19/2015  . Pneumococcal Conjugate-13 02/01/2017  . Td 11/22/2007  . Tdap 11/22/2007  . Zoster 03/23/2014   Health Maintenance Due  Topic Date Due  . HIV Screening  10/07/1966    Activities of Daily Living In your present state of health, do you have any difficulty performing the following activities: 02/01/2017  Hearing? N  Vision? Y  Comment Has  cataract in Lt eye  Difficulty concentrating or making decisions? N  Walking or climbing stairs? N  Dressing or bathing? N  Doing errands, shopping? N  Some recent data might be hidden    Physical Exam   BP (!) 158/86 (BP Location: Right Arm, Patient Position: Sitting, Cuff Size: Normal)   Pulse 61   Temp 98.1 F (36.7 C) (Oral)   Resp 16   Ht 5\' 5"  (1.651 m)   Wt 152 lb (68.9 kg)   SpO2 99%   BMI 25.29 kg/m  General appearance: alert, cooperative, appears stated age and no distress Head: Normocephalic, without obvious abnormality, atraumatic Eyes: conjunctivae/corneas clear. PERRL, EOM's intact. Fundi benign. Ears: normal TM's and external ear canals both ears Nose: Nares normal. Septum midline. Mucosa normal. No drainage or sinus tenderness. Throat: lips, mucosa, and tongue normal; teeth and gums normal Neck: no adenopathy, no carotid bruit, no JVD, supple, symmetrical, trachea midline and thyroid not enlarged, symmetric, no tenderness/mass/nodules Back: symmetric, no curvature. ROM normal. No CVA tenderness. Lungs: clear to auscultation bilaterally Breasts: gyn Heart: regular rate and rhythm, S1, S2 normal, no murmur, click, rub or gallop Abdomen: soft, non-tender; bowel sounds normal; no masses,  no organomegaly Pelvic: deferred---gyn Extremities: extremities normal, atraumatic, no cyanosis or edema Pulses: 2+ and symmetric Skin: Skin color, texture, turgor normal. No rashes or lesions Lymph nodes: Cervical, supraclavicular, and axillary nodes normal. Neurologic: Alert and oriented X 3, normal strength and tone. Normal symmetric reflexes. Normal coordination and gait  Advanced Directives: Does Patient Have a Medical Advance Directive?: No Would patient like information on creating a medical advance directive?: No - Patient declined    Assessment:    This is a routine wellness examination for this patient .  Vision/Hearing screen Hearing Screening Comments: Whisper  normal Vision Screening Comments: opth annually---+ cataract  Dietary issues and exercise activities discussed:  Current Exercise Habits: Home exercise routine;Structured exercise class, Type of exercise: calisthenics;walking, Time (Minutes): 40, Frequency (Times/Week): 3, Weekly Exercise (Minutes/Week): 120, Intensity: Mild, Exercise limited by: None identified  Goals    None     Depression Screen PHQ 2/9 Scores 02/01/2017 12/05/2012  PHQ - 2 Score 0 0     Fall Risk Fall Risk  02/01/2017  Falls in the past year? No    Cognitive Function: MMSE - Mini Mental State Exam 02/01/2017  Orientation to time 5  Orientation to Place 5  Registration 3  Attention/ Calculation 5  Recall 3  Language- name 2 objects 2  Language- repeat 1  Language- follow 3 step command 3  Language- read & follow direction 1  Write a sentence 1  Copy design 1  Total score 30        Patient Care Team: Ann Held, DO as PCP - General Karl Luke, MD as Referring Physician (Optometry)     Plan:   see  avs  ghm utd Check labs  I have personally reviewed and noted the following in the patient's chart:   . Medical and social history . Use of alcohol, tobacco or illicit drugs  . Current medications and supplements . Functional ability and status . Nutritional status . Physical activity . Advanced directives . List of other physicians . Hospitalizations, surgeries, and ER visits in previous 12 months . Vitals . Screenings to include cognitive, depression, and falls . Referrals and appointments  In addition, I have reviewed and discussed with patient certain preventive protocols, quality metrics, and best practice recommendations. A written personalized care plan for preventive services as well as general preventive health recommendations were provided to patient.    1. Need for immunization against influenza  - Flu Vaccine QUAD 36+ mos IM  2. Preventative health care See  above - Lipid panel - CBC with Differential/Platelet - Comprehensive metabolic panel - TSH - POCT Urinalysis Dipstick (Automated)  3. Osteopenia of both thighs  - DG Bone Density; Future  4. Welcome to Medicare preventive visit See above - EKG 12-Lead  5. Encounter for Medicare annual wellness exam See above  6. Need for pneumococcal vaccination   - Pneumococcal conjugate vaccine 13-valent IM   Ann Held, DO 02/04/2017

## 2017-02-02 LAB — COMPREHENSIVE METABOLIC PANEL
ALT: 11 U/L (ref 0–35)
AST: 16 U/L (ref 0–37)
Albumin: 4.2 g/dL (ref 3.5–5.2)
Alkaline Phosphatase: 61 U/L (ref 39–117)
BILIRUBIN TOTAL: 0.4 mg/dL (ref 0.2–1.2)
BUN: 12 mg/dL (ref 6–23)
CALCIUM: 8.6 mg/dL (ref 8.4–10.5)
CO2: 27 meq/L (ref 19–32)
Chloride: 98 mEq/L (ref 96–112)
Creatinine, Ser: 0.55 mg/dL (ref 0.40–1.20)
GFR: 117.78 mL/min (ref >60.00)
Glucose, Bld: 85 mg/dL (ref 70–99)
Potassium: 4 mEq/L (ref 3.5–5.1)
SODIUM: 134 meq/L — AB (ref 135–145)
Total Protein: 6.9 g/dL (ref 6.0–8.3)

## 2017-02-02 LAB — CBC WITH DIFFERENTIAL/PLATELET
Basophils Absolute: 0.1 10*3/uL (ref 0.0–0.1)
Basophils Relative: 1 % (ref 0.0–3.0)
EOS ABS: 0.1 10*3/uL (ref 0.0–0.7)
Eosinophils Relative: 1.1 % (ref 0.0–5.0)
HCT: 42 % (ref 36.0–46.0)
Hemoglobin: 13.7 g/dL (ref 12.0–15.0)
LYMPHS PCT: 22.4 % (ref 12.0–46.0)
Lymphs Abs: 1.4 10*3/uL (ref 0.7–4.0)
MCHC: 32.7 g/dL (ref 30.0–36.0)
MCV: 90.7 fl (ref 78.0–100.0)
Monocytes Absolute: 0.5 10*3/uL (ref 0.1–1.0)
Monocytes Relative: 7.4 % (ref 3.0–12.0)
NEUTROS PCT: 68.1 % (ref 43.0–77.0)
Neutro Abs: 4.2 10*3/uL (ref 1.4–7.7)
Platelets: 266 10*3/uL (ref 150.0–400.0)
RBC: 4.62 Mil/uL (ref 3.87–5.11)
RDW: 14.4 % (ref 11.5–15.5)
WBC: 6.2 10*3/uL (ref 4.0–10.5)

## 2017-02-02 LAB — LIPID PANEL
Cholesterol: 170 mg/dL (ref 0–200)
HDL: 75.8 mg/dL (ref >39.00)
LDL CALC: 85 mg/dL (ref 0–99)
NonHDL: 93.82
TRIGLYCERIDES: 43 mg/dL (ref 0.0–149.0)
Total CHOL/HDL Ratio: 2
VLDL: 8.6 mg/dL (ref 0.0–40.0)

## 2017-02-02 LAB — TSH: TSH: 1.37 u[IU]/mL (ref 0.35–4.50)

## 2017-03-02 ENCOUNTER — Ambulatory Visit
Admission: RE | Admit: 2017-03-02 | Discharge: 2017-03-02 | Disposition: A | Discharge status: Home / Self Care | Payer: PPO | Source: Ambulatory Visit | Attending: Family Medicine | Admitting: Family Medicine

## 2017-03-02 DIAGNOSIS — M85852 Other specified disorders of bone density and structure, left thigh: Principal | ICD-10-CM

## 2017-03-02 DIAGNOSIS — M85851 Other specified disorders of bone density and structure, right thigh: Secondary | ICD-10-CM

## 2017-03-02 DIAGNOSIS — Z780 Asymptomatic menopausal state: Secondary | ICD-10-CM | POA: Diagnosis not present

## 2017-11-19 ENCOUNTER — Encounter: Payer: Self-pay | Admitting: Family Medicine

## 2017-11-19 ENCOUNTER — Other Ambulatory Visit: Payer: Self-pay | Admitting: Family Medicine

## 2017-11-19 DIAGNOSIS — Z1231 Encounter for screening mammogram for malignant neoplasm of breast: Secondary | ICD-10-CM

## 2017-12-11 ENCOUNTER — Ambulatory Visit
Admission: RE | Admit: 2017-12-11 | Discharge: 2017-12-11 | Disposition: A | Discharge status: Home / Self Care | Payer: PPO | Source: Ambulatory Visit | Attending: Family Medicine | Admitting: Family Medicine

## 2017-12-11 DIAGNOSIS — Z1231 Encounter for screening mammogram for malignant neoplasm of breast: Secondary | ICD-10-CM | POA: Diagnosis not present

## 2018-02-26 ENCOUNTER — Ambulatory Visit (INDEPENDENT_AMBULATORY_CARE_PROVIDER_SITE_OTHER): Payer: PPO | Admitting: Family Medicine

## 2018-02-26 ENCOUNTER — Encounter: Payer: Self-pay | Admitting: Family Medicine

## 2018-02-26 VITALS — BP 126/66 | HR 78 | Temp 98.1°F | Resp 16 | Ht 65.0 in | Wt 153.6 lb

## 2018-02-26 DIAGNOSIS — Z0000 Encounter for general adult medical examination without abnormal findings: Secondary | ICD-10-CM | POA: Diagnosis not present

## 2018-02-26 DIAGNOSIS — Z780 Asymptomatic menopausal state: Secondary | ICD-10-CM

## 2018-02-26 DIAGNOSIS — G43109 Migraine with aura, not intractable, without status migrainosus: Secondary | ICD-10-CM | POA: Insufficient documentation

## 2018-02-26 DIAGNOSIS — Z23 Encounter for immunization: Secondary | ICD-10-CM | POA: Diagnosis not present

## 2018-02-26 LAB — COMPREHENSIVE METABOLIC PANEL
ALBUMIN: 4.2 g/dL (ref 3.5–5.2)
ALK PHOS: 77 U/L (ref 39–117)
ALT: 10 U/L (ref 0–35)
AST: 14 U/L (ref 0–37)
BUN: 12 mg/dL (ref 6–23)
CO2: 29 mEq/L (ref 19–32)
CREATININE: 0.56 mg/dL (ref 0.40–1.20)
Calcium: 9 mg/dL (ref 8.4–10.5)
Chloride: 99 mEq/L (ref 96–112)
GFR: 114.98 mL/min (ref >60.00)
GLUCOSE: 95 mg/dL (ref 70–99)
POTASSIUM: 4.2 meq/L (ref 3.5–5.1)
SODIUM: 134 meq/L — AB (ref 135–145)
TOTAL PROTEIN: 6.7 g/dL (ref 6.0–8.3)
Total Bilirubin: 0.6 mg/dL (ref 0.2–1.2)

## 2018-02-26 LAB — CBC WITH DIFFERENTIAL/PLATELET
BASOS ABS: 0.1 10*3/uL (ref 0.0–0.1)
Basophils Relative: 1.4 % (ref 0.0–3.0)
EOS ABS: 0 10*3/uL (ref 0.0–0.7)
EOS PCT: 0.8 % (ref 0.0–5.0)
HCT: 41.9 % (ref 36.0–46.0)
HEMOGLOBIN: 13.8 g/dL (ref 12.0–15.0)
LYMPHS ABS: 0.9 10*3/uL (ref 0.7–4.0)
Lymphocytes Relative: 19.5 % (ref 12.0–46.0)
MCHC: 33 g/dL (ref 30.0–36.0)
MCV: 89.5 fl (ref 78.0–100.0)
MONO ABS: 0.3 10*3/uL (ref 0.1–1.0)
Monocytes Relative: 7.8 % (ref 3.0–12.0)
NEUTROS PCT: 70.5 % (ref 43.0–77.0)
Neutro Abs: 3.2 10*3/uL (ref 1.4–7.7)
Platelets: 258 10*3/uL (ref 150.0–400.0)
RBC: 4.68 Mil/uL (ref 3.87–5.11)
RDW: 14 % (ref 11.5–15.5)
WBC: 4.5 10*3/uL (ref 4.0–10.5)

## 2018-02-26 LAB — LIPID PANEL
CHOLESTEROL: 166 mg/dL (ref 0–200)
HDL: 78.1 mg/dL (ref >39.00)
LDL Cholesterol: 77 mg/dL (ref 0–99)
NonHDL: 88.3
Total CHOL/HDL Ratio: 2
Triglycerides: 57 mg/dL (ref 0.0–149.0)
VLDL: 11.4 mg/dL (ref 0.0–40.0)

## 2018-02-26 MED ORDER — ESTRADIOL 0.05 MG/24HR TD PTTW: 1.0000 | MEDICATED_PATCH | TRANSDERMAL | 1 refills | Status: DC

## 2018-02-26 NOTE — Patient Instructions (Signed)
Preventive Care 66 Years and Older, Female Preventive care refers to lifestyle choices and visits with your health care provider that can promote health and wellness. What does preventive care include?  A yearly physical exam. This is also called an annual well check.  Dental exams once or twice a year.  Routine eye exams. Ask your health care provider how often you should have your eyes checked.  Personal lifestyle choices, including: ? Daily care of your teeth and gums. ? Regular physical activity. ? Eating a healthy diet. ? Avoiding tobacco and drug use. ? Limiting alcohol use. ? Practicing safe sex. ? Taking low-dose aspirin every day. ? Taking vitamin and mineral supplements as recommended by your health care provider. What happens during an annual well check? The services and screenings done by your health care provider during your annual well check will depend on your age, overall health, lifestyle risk factors, and family history of disease. Counseling Your health care provider may ask you questions about your:  Alcohol use.  Tobacco use.  Drug use.  Emotional well-being.  Home and relationship well-being.  Sexual activity.  Eating habits.  History of falls.  Memory and ability to understand (cognition).  Work and work environment.  Reproductive health.  Screening You may have the following tests or measurements:  Height, weight, and BMI.  Blood pressure.  Lipid and cholesterol levels. These may be checked every 5 years, or more frequently if you are over 50 years old.  Skin check.  Lung cancer screening. You may have this screening every year starting at age 55 if you have a 30-pack-year history of smoking and currently smoke or have quit within the past 15 years.  Fecal occult blood test (FOBT) of the stool. You may have this test every year starting at age 50.  Flexible sigmoidoscopy or colonoscopy. You may have a sigmoidoscopy every 5 years or  a colonoscopy every 10 years starting at age 50.  Hepatitis C blood test.  Hepatitis B blood test.  Sexually transmitted disease (STD) testing.  Diabetes screening. This is done by checking your blood sugar (glucose) after you have not eaten for a while (fasting). You may have this done every 1-3 years.  Bone density scan. This is done to screen for osteoporosis. You may have this done starting at age 65.  Mammogram. This may be done every 1-2 years. Talk to your health care provider about how often you should have regular mammograms.  Talk with your health care provider about your test results, treatment options, and if necessary, the need for more tests. Vaccines Your health care provider may recommend certain vaccines, such as:  Influenza vaccine. This is recommended every year.  Tetanus, diphtheria, and acellular pertussis (Tdap, Td) vaccine. You may need a Td booster every 10 years.  Varicella vaccine. You may need this if you have not been vaccinated.  Zoster vaccine. You may need this after age 60.  Measles, mumps, and rubella (MMR) vaccine. You may need at least one dose of MMR if you were born in 1957 or later. You may also need a second dose.  Pneumococcal 13-valent conjugate (PCV13) vaccine. One dose is recommended after age 65.  Pneumococcal polysaccharide (PPSV23) vaccine. One dose is recommended after age 65.  Meningococcal vaccine. You may need this if you have certain conditions.  Hepatitis A vaccine. You may need this if you have certain conditions or if you travel or work in places where you may be exposed to hepatitis   A.  Hepatitis B vaccine. You may need this if you have certain conditions or if you travel or work in places where you may be exposed to hepatitis B.  Haemophilus influenzae type b (Hib) vaccine. You may need this if you have certain conditions.  Talk to your health care provider about which screenings and vaccines you need and how often you  need them. This information is not intended to replace advice given to you by your health care provider. Make sure you discuss any questions you have with your health care provider. Document Released: 05/07/2015 Document Revised: 12/29/2015 Document Reviewed: 02/09/2015 Elsevier Interactive Patient Education  2018 [employer]  

## 2018-02-26 NOTE — Progress Notes (Signed)
Subjective:     Laura Price is a 67 y.o. female and is here for a comprehensive physical exam. The patient reports no problems.  Social History   Socioeconomic History  . Marital status: Married    Spouse name: Not on file  . Number of children: Not on file  . Years of education: Not on file  . Highest education level: Not on file  Occupational History  . 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 Financial resource strain: Not on file  . Food insecurity:    Worry: Not on file    Inability: Not on file  . Transportation needs:    Medical: Not on file    Non-medical: Not on file  Tobacco Use  . Smoking status: Never Smoker  . Smokeless tobacco: Never Used  Substance and Sexual Activity  . Alcohol use: Yes    Alcohol/week: 2.0 standard drinks    Types: 2 Glasses of wine per week  . Drug use: No  . Sexual activity: Yes    Partners: Male  Lifestyle  . Physical activity:    Days per week: Not on file    Minutes per session: Not on file  . Stress: Not on file  Relationships  . Social connections:    Talks on phone: Not on file    Gets together: Not on file    Attends religious service: Not on file    Active member of club or organization: Not on file    Attends meetings of clubs or organizations: Not on file    Relationship status: Not on file  . Intimate partner violence:    Fear of current or ex partner: Not on file    Emotionally abused: Not on file    Physically abused: Not on file    Forced sexual activity: Not on file  Other Topics Concern  . Not on file  Social History Narrative   Exercise-- walk almost everyday   Health Maintenance  Topic Date Due  . [name]  11/21/2017  . MAMMOGRAM  12/12/2018  . DEXA SCAN  03/03/2019  . COLONOSCOPY  11/01/2022  . INFLUENZA VACCINE  Completed  . Hepatitis C Screening  Completed  . PNA vac Low Risk Adult  Completed    The following  portions of the patient's history were reviewed and updated as appropriate: She  has a past medical history of Allergic rhinitis. She does not have any pertinent problems on file. She  has a past surgical history that includes Total abdominal hysterectomy w/ bilateral salpingoophorectomy; Myringotomy; and Breast lumpectomy (Left). Her family history includes Arthritis in her brother; Atrial fibrillation in her mother; Breast cancer (age of onset: 19) in her mother; Cancer (age of onset: 73) in her father and mother; Heart disease in her mother; Hypertension in her father and mother; Osteoarthritis (age of onset: 77) in her unknown relative; Prostate cancer in her father; Thyroid disease in her brother, mother, and sister. She  reports that she has never smoked. She has never used smokeless tobacco. She reports that she drinks about 2.0 standard drinks of alcohol per week. She reports that she does not use drugs. She has a current medication list which includes the following prescription(s): aspirin, calcium-vitamin d-vitamin k, estradiol, fluticasone, and multivitamin gummies womens. Current Outpatient Medications on File Prior to Visit  Medication Sig Dispense Refill  . aspirin 81 MG tablet Take 81 mg  by mouth daily.    . Calcium-Vitamin D-Vitamin K (VIACTIV CALCIUM PLUS D) 650-12.5-40 MG-MCG-MCG CHEW Chew 1 tablet by mouth daily.    . fluticasone (FLONASE) 50 MCG/ACT nasal spray USE 1 SPRAY INTO EACH NOSTRIL TWICE DAILY 16 g 5  . Multiple Vitamins-Minerals (MULTIVITAMIN GUMMIES WOMENS) CHEW Chew 2 tablets by mouth daily.     No current facility-administered medications on file prior to visit.    She is allergic to sulfonamide derivatives..  Review of Systems Review of Systems  Constitutional: Negative for activity change, appetite change and fatigue.  HENT: Negative for hearing loss, congestion, tinnitus and ear discharge.  dentist q65m Eyes: Negative for visual disturbance (see optho q1y --  vision corrected to 20/20 with glasses).  Respiratory: Negative for cough, chest tightness and shortness of breath.   Cardiovascular: Negative for chest pain, palpitations and leg swelling.  Gastrointestinal: Negative for abdominal pain, diarrhea, constipation and abdominal distention.  Genitourinary: Negative for urgency, frequency, decreased urine volume and difficulty urinating.  Musculoskeletal: Negative for back pain, arthralgias and gait problem.  Skin: Negative for color change, pallor and rash.  Neurological: Negative for dizziness, light-headedness, numbness and headaches.  Hematological: Negative for adenopathy. Does not bruise/bleed easily.  Psychiatric/Behavioral: Negative for suicidal ideas, confusion, sleep disturbance, self-injury, dysphoric mood, decreased concentration and agitation.       Objective:    BP 126/66 (BP Location: Left Arm, Cuff Size: Normal)   Pulse 78   Temp 98.1 F (36.7 C) (Oral)   Resp 16   Ht 5\' 5"  (1.651 m)   Wt 153 lb 9.6 oz (69.7 kg)   SpO2 93%   BMI 25.56 kg/m  General appearance: alert, cooperative, appears stated age and no distress Head: Normocephalic, without obvious abnormality, atraumatic Eyes: conjunctivae/corneas clear. PERRL, EOM's intact. Fundi benign. Ears: normal TM's and external ear canals both ears Nose: Nares normal. Septum midline. Mucosa normal. No drainage or sinus tenderness. Throat: lips, mucosa, and tongue normal; teeth and gums normal Neck: no adenopathy, no carotid bruit, no JVD, supple, symmetrical, trachea midline and thyroid not enlarged, symmetric, no tenderness/mass/nodules Back: symmetric, no curvature. ROM normal. No CVA tenderness. Lungs: clear to auscultation bilaterally Breasts: normal appearance, no masses or tenderness Heart: regular rate and rhythm, S1, S2 normal, no murmur, click, rub or gallop Abdomen: soft, non-tender; bowel sounds normal; no masses,  no organomegaly Pelvic: not indicated; status  post hysterectomy, negative ROS Extremities: extremities normal, atraumatic, no cyanosis or edema Pulses: 2+ and symmetric Skin: Skin color, texture, turgor normal. No rashes or lesions Lymph nodes: Cervical, supraclavicular, and axillary nodes normal. Neurologic: Alert and oriented X 3, normal strength and tone. Normal symmetric reflexes. Normal coordination and gait    Assessment:    Healthy female exam.      Plan:    ghm utd Check labs  See After Visit Summary for Counseling Recommendations    1. Preventative health care See above  - CBC with Differential/Platelet - Comprehensive metabolic panel - Lipid panel  2. Menopause Stable Pt does not want to stop or change dose  - estradiol (VIVELLE-DOT) 0.05 MG/24HR patch; Place 1 patch (0.05 mg total) onto the skin 2 (two) times a week.  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 Need for pneumococcal vaccination  - Pneumococcal polysaccharide vaccine 23-valent greater than or equal to 2yo subcutaneous/IM

## 2018-02-28 ENCOUNTER — Encounter: Payer: Self-pay | Admitting: Family Medicine

## 2018-02-28 NOTE — Telephone Encounter (Signed)
Can be normal ---  She can come in and let nurse see if she like She can use warm compresses  Alt with cold If redness worsens she needs to come in

## 2018-03-06 ENCOUNTER — Encounter: Payer: Self-pay | Admitting: *Deleted

## 2018-08-18 ENCOUNTER — Other Ambulatory Visit: Payer: Self-pay | Admitting: Family Medicine

## 2018-08-18 DIAGNOSIS — Z780 Asymptomatic menopausal state: Secondary | ICD-10-CM

## 2018-11-18 ENCOUNTER — Other Ambulatory Visit: Payer: Self-pay | Admitting: Family Medicine

## 2018-11-18 DIAGNOSIS — Z1231 Encounter for screening mammogram for malignant neoplasm of breast: Secondary | ICD-10-CM

## 2019-01-02 ENCOUNTER — Ambulatory Visit
Admission: RE | Admit: 2019-01-02 | Discharge: 2019-01-02 | Disposition: A | Discharge status: Home / Self Care | Payer: PPO | Source: Ambulatory Visit | Attending: Family Medicine | Admitting: Family Medicine

## 2019-01-02 ENCOUNTER — Other Ambulatory Visit: Payer: Self-pay

## 2019-01-02 DIAGNOSIS — Z1231 Encounter for screening mammogram for malignant neoplasm of breast: Secondary | ICD-10-CM | POA: Diagnosis not present

## 2019-03-05 ENCOUNTER — Other Ambulatory Visit: Payer: Self-pay

## 2019-03-06 ENCOUNTER — Encounter: Payer: Self-pay | Admitting: Family Medicine

## 2019-03-06 ENCOUNTER — Ambulatory Visit (INDEPENDENT_AMBULATORY_CARE_PROVIDER_SITE_OTHER): Payer: PPO | Admitting: Family Medicine

## 2019-03-06 VITALS — BP 120/70 | HR 64 | Temp 97.3°F | Resp 18 | Ht 65.0 in | Wt 156.4 lb

## 2019-03-06 DIAGNOSIS — E2839 Other primary ovarian failure: Secondary | ICD-10-CM

## 2019-03-06 DIAGNOSIS — Z0000 Encounter for general adult medical examination without abnormal findings: Secondary | ICD-10-CM | POA: Diagnosis not present

## 2019-03-06 DIAGNOSIS — Z8349 Family history of other endocrine, nutritional and metabolic diseases: Secondary | ICD-10-CM | POA: Diagnosis not present

## 2019-03-06 DIAGNOSIS — Z136 Encounter for screening for cardiovascular disorders: Secondary | ICD-10-CM | POA: Insufficient documentation

## 2019-03-06 LAB — COMPREHENSIVE METABOLIC PANEL
ALT: 12 U/L (ref 0–35)
AST: 15 U/L (ref 0–37)
Albumin: 4.2 g/dL (ref 3.5–5.2)
Alkaline Phosphatase: 74 U/L (ref 39–117)
BUN: 14 mg/dL (ref 6–23)
CO2: 28 mEq/L (ref 19–32)
Calcium: 8.7 mg/dL (ref 8.4–10.5)
Chloride: 99 mEq/L (ref 96–112)
Creatinine, Ser: 0.58 mg/dL (ref 0.40–1.20)
GFR: 103.57 mL/min (ref >60.00)
Glucose, Bld: 102 mg/dL — ABNORMAL HIGH (ref 70–99)
Potassium: 4 mEq/L (ref 3.5–5.1)
Sodium: 133 mEq/L — ABNORMAL LOW (ref 135–145)
Total Bilirubin: 0.5 mg/dL (ref 0.2–1.2)
Total Protein: 6.7 g/dL (ref 6.0–8.3)

## 2019-03-06 LAB — LIPID PANEL
Cholesterol: 172 mg/dL (ref 0–200)
HDL: 76.9 mg/dL (ref >39.00)
LDL Cholesterol: 86 mg/dL (ref 0–99)
NonHDL: 95.22
Total CHOL/HDL Ratio: 2
Triglycerides: 47 mg/dL (ref 0.0–149.0)
VLDL: 9.4 mg/dL (ref 0.0–40.0)

## 2019-03-06 NOTE — Progress Notes (Signed)
Subjective:     Laura Price is a 68 y.o. female and is here for a comprehensive physical exam. The patient reports no problems.  Social History   Socioeconomic History  . Marital status: Married    Spouse name: Not on file  . Number of children: Not on file  . Years of education: Not on file  . Highest education level: Not on file  Occupational History  . 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 Financial resource strain: Not on file  . Food insecurity    Worry: Not on file    Inability: Not on file  . Transportation needs    Medical: Not on file    Non-medical: Not on file  Tobacco Use  . Smoking status: Never Smoker  . Smokeless tobacco: Never Used  Substance and Sexual Activity  . Alcohol use: Yes    Alcohol/week: 2.0 standard drinks    Types: 2 Glasses of wine per week  . Drug use: No  . Sexual activity: Yes    Partners: Male  Lifestyle  . Physical activity    Days per week: Not on file    Minutes per session: Not on file  . Stress: Not on file  Relationships  . Social Herbalist on phone: Not on file    Gets together: Not on file    Attends religious service: Not on file    Active member of club or organization: Not on file    Attends meetings of clubs or organizations: Not on file    Relationship status: Not on file  . Intimate partner violence    Fear of current or ex partner: Not on file    Emotionally abused: Not on file    Physically abused: Not on file    Forced sexual activity: Not on file  Other Topics Concern  . Not on file  Social History Narrative   Exercise-- walk almost everyday   Health Maintenance  Topic Date Due  . [name]  11/21/2017  . DEXA SCAN  03/03/2019  . MAMMOGRAM  01/02/2020  . COLONOSCOPY  11/01/2022  . INFLUENZA VACCINE  Completed  . Hepatitis C Screening  Completed  . PNA vac Low Risk Adult  Completed    The following portions  of the patient's history were reviewed and updated as appropriate:  She  has a past medical history of Allergic rhinitis. She does not have any pertinent problems on file. She  has a past surgical history that includes Total abdominal hysterectomy w/ bilateral salpingoophorectomy; Myringotomy; and Breast lumpectomy (Left). Her family history includes Arthritis in her brother; Atrial fibrillation in her mother; Breast cancer (age of onset: 2) in her mother; Cancer (age of onset: 43) in her father and mother; Heart disease in her mother; Hypertension in her father and mother; Osteoarthritis (age of onset: 32) in an other family member; Prostate cancer in her father; Thyroid disease in her brother, mother, and sister. She  reports that she has never smoked. She has never used smokeless tobacco. She reports current alcohol use of about 2.0 standard drinks of alcohol per week. She reports that she does not use drugs. She has a current medication list which includes the following prescription(s): aspirin, calcium-vitamin d-vitamin k, estradiol, fluticasone, and multivitamin gummies womens. Current Outpatient Medications on File Prior to Visit  Medication Sig Dispense Refill  . aspirin 81 MG tablet  Take 81 mg by mouth daily.    . Calcium-Vitamin D-Vitamin K (VIACTIV CALCIUM PLUS D) 650-12.5-40 MG-MCG-MCG CHEW Chew 1 tablet by mouth daily.    [name] estradiol (VIVELLE-DOT) 0.05 MG/24HR patch APPLY 1 PATCH(0.05 MG) EXTERNALLY TO THE SKIN 2 TIMES A WEEK 24 patch 1  . fluticasone (FLONASE) 50 MCG/ACT nasal spray USE 1 SPRAY INTO EACH NOSTRIL TWICE DAILY 16 g 5  . Multiple Vitamins-Minerals (MULTIVITAMIN GUMMIES WOMENS) CHEW Chew 2 tablets by mouth daily.     No current facility-administered medications on file prior to visit.    She is allergic to sulfonamide derivatives..  Review of Systems Review of Systems  Constitutional: Negative for activity change, appetite change and fatigue.  HENT: Negative for  hearing loss, congestion, tinnitus and ear discharge.  dentist q73m Eyes: Negative for visual disturbance (see optho q1y -- vision corrected to 20/20 with glasses).  Respiratory: Negative for cough, chest tightness and shortness of breath.   Cardiovascular: Negative for chest pain, palpitations and leg swelling.  Gastrointestinal: Negative for abdominal pain, diarrhea, constipation and abdominal distention.  Genitourinary: Negative for urgency, frequency, decreased urine volume and difficulty urinating.  Musculoskeletal: Negative for back pain, arthralgias and gait problem.  Skin: Negative for color change, pallor and rash.  Neurological: Negative for dizziness, light-headedness, numbness and headaches.  Hematological: Negative for adenopathy. Does not bruise/bleed easily.  Psychiatric/Behavioral: Negative for suicidal ideas, confusion, sleep disturbance, self-injury, dysphoric mood, decreased concentration and agitation.       Objective:    BP 120/70 (BP Location: Right Arm, Patient Position: Sitting, Cuff Size: Normal)   Pulse 64   Temp (!) 97.3 F (36.3 C) (Temporal)   Resp 18   Ht 5\' 5"  (1.651 m)   Wt 156 lb 6.4 oz (70.9 kg)   SpO2 99%   BMI 26.03 kg/m  General appearance: alert, cooperative, appears stated age and no distress Head: Normocephalic, without obvious abnormality, atraumatic Eyes: negative findings: lids and lashes normal, conjunctivae and sclerae normal and pupils equal, round, reactive to light and accomodation Ears: normal TM's and external ear canals both ears Nose: Nares normal. Septum midline. Mucosa normal. No drainage or sinus tenderness. Throat: lips, mucosa, and tongue normal; teeth and gums normal Neck: no adenopathy, no carotid bruit, no JVD, supple, symmetrical, trachea midline and thyroid not enlarged, symmetric, no tenderness/mass/nodules Back: symmetric, no curvature. ROM normal. No CVA tenderness. Lungs: clear to auscultation bilaterally Breasts:  normal appearance, no masses or tenderness Heart: regular rate and rhythm, S1, S2 normal, no murmur, click, rub or gallop Abdomen: soft, non-tender; bowel sounds normal; no masses,  no organomegaly Pelvic: not indicated; status post hysterectomy, negative ROS Extremities: extremities normal, atraumatic, no cyanosis or edema Pulses: 2+ and symmetric Skin: Skin color, texture, turgor normal. No rashes or lesions Lymph nodes: Cervical, supraclavicular, and axillary nodes normal. Neurologic: Alert and oriented X 3, normal strength and tone. Normal symmetric reflexes. Normal coordination and gait    Assessment:    Healthy female exam.      Plan:    ghm utd Check labs  See After Visit Summary for Counseling Recommendations    1. Ischemic heart disease screen  - Lipid panel - Comprehensive metabolic panel  2. Estrogen deficiency Check bmd with next mammogram  3. Family history of thyroid disease  - Thyroid Panel With TSH  4. Preventative health care See above

## 2019-03-06 NOTE — Patient Instructions (Signed)
Preventive Care 67 Years and Older, Female Preventive care refers to lifestyle choices and visits with your health care provider that can promote health and wellness. This includes:  A yearly physical exam. This is also called an annual well check.  Regular dental and eye exams.  Immunizations.  Screening for certain conditions.  Healthy lifestyle choices, such as diet and exercise. What can I expect for my preventive care visit? Physical exam Your health care provider will check:  Height and weight. These may be used to calculate body mass index (BMI), which is a measurement that tells if you are at a healthy weight.  Heart rate and blood pressure.  Your skin for abnormal spots. Counseling Your health care provider may ask you questions about:  Alcohol, tobacco, and drug use.  Emotional well-being.  Home and relationship well-being.  Sexual activity.  Eating habits.  History of falls.  Memory and ability to understand (cognition).  Work and work Statistician.  Pregnancy and menstrual history. What immunizations do I need?  Influenza (flu) vaccine  This is recommended every year. Tetanus, diphtheria, and pertussis (Tdap) vaccine  You may need a Td booster every 10 years. Varicella (chickenpox) vaccine  You may need this vaccine if you have not already been vaccinated. Zoster (shingles) vaccine  You may need this after age 67. Pneumococcal conjugate (PCV13) vaccine  One dose is recommended after age 67. Pneumococcal polysaccharide (PPSV23) vaccine  One dose is recommended after age 67. Measles, mumps, and rubella (MMR) vaccine  You may need at least one dose of MMR if you were born in 1957 or later. You may also need a second dose. Meningococcal conjugate (MenACWY) vaccine  You may need this if you have certain conditions. Hepatitis A vaccine  You may need this if you have certain conditions or if you travel or work in places where you may be exposed  to hepatitis A. Hepatitis B vaccine  You may need this if you have certain conditions or if you travel or work in places where you may be exposed to hepatitis B. Haemophilus influenzae type b (Hib) vaccine  You may need this if you have certain conditions. You may receive vaccines as individual doses or as more than one vaccine together in one shot (combination vaccines). Talk with your health care provider about the risks and benefits of combination vaccines. What tests do I need? Blood tests  Lipid and cholesterol levels. These may be checked every 5 years, or more frequently depending on your overall health.  Hepatitis C test.  Hepatitis B test. Screening  Lung cancer screening. You may have this screening every year starting at age 67 if you have a 30-pack-year history of smoking and currently smoke or have quit within the past 15 years.  Colorectal cancer screening. All adults should have this screening starting at age 36 and continuing until age 15. Your health care provider may recommend screening at age 23 if you are at increased risk. You will have tests every 1-10 years, depending on your results and the type of screening test.  Diabetes screening. This is done by checking your blood sugar (glucose) after you have not eaten for a while (fasting). You may have this done every 1-3 years.  Mammogram. This may be done every 1-2 years. Talk with your health care provider about how often you should have regular mammograms.  BRCA-related cancer screening. This may be done if you have a family history of breast, ovarian, tubal, or peritoneal cancers.  Other tests  Sexually transmitted disease (STD) testing.  Bone density scan. This is done to screen for osteoporosis. You may have this done starting at age 76. Follow these instructions at home: Eating and drinking  Eat a diet that includes fresh fruits and vegetables, whole grains, lean protein, and low-fat dairy products. Limit  your intake of foods with high amounts of sugar, saturated fats, and salt.  Take vitamin and mineral supplements as recommended by your health care provider.  Do not drink alcohol if your health care provider tells you not to drink.  If you drink alcohol: ? Limit how much you have to 0-1 drink a day. ? Be aware of how much alcohol is in your drink. In the [place] one drink equals one 12 oz bottle of beer (355 mL), one 5 oz glass of wine (148 mL), or one 1 oz glass of hard liquor (44 mL). Lifestyle  Take daily care of your teeth and gums.  Stay active. Exercise for at least 30 minutes on 5 or more days each week.  Do not use any products that contain nicotine or tobacco, such as cigarettes, e-cigarettes, and chewing tobacco. If you need help quitting, ask your health care provider.  If you are sexually active, practice safe sex. Use a condom or other form of protection in order to prevent STIs (sexually transmitted infections).  Talk with your health care provider about taking a low-dose aspirin or statin. What's next?  Go to your health care provider once a year for a well check visit.  Ask your health care provider how often you should have your eyes and teeth checked.  Stay up to date on all vaccines. This information is not intended to replace advice given to you by your health care provider. Make sure you discuss any questions you have with your health care provider. Document Released: 05/07/2015 Document Revised: 04/04/2018 Document Reviewed: 04/04/2018 Elsevier Patient Education  2020 [employer].

## 2019-03-07 LAB — THYROID PANEL WITH TSH
Free Thyroxine Index: 2.4 (ref 1.4–3.8)
T3 Uptake: 29 % (ref 22–35)
T4, Total: 8.4 ug/dL (ref 5.1–11.9)
TSH: 1.85 mIU/L (ref 0.40–4.50)

## 2019-05-14 ENCOUNTER — Encounter: Payer: Self-pay | Admitting: Family Medicine

## 2019-05-14 ENCOUNTER — Other Ambulatory Visit: Payer: Self-pay | Admitting: Family Medicine

## 2019-05-14 DIAGNOSIS — Z780 Asymptomatic menopausal state: Secondary | ICD-10-CM

## 2019-05-14 MED ORDER — ESTRADIOL 0.05 MG/24HR TD PTTW: MEDICATED_PATCH | TRANSDERMAL | 3 refills | Status: DC

## 2019-05-14 NOTE — Telephone Encounter (Signed)
I never received a rx refill request  Refilled today

## 2019-05-19 ENCOUNTER — Other Ambulatory Visit: Payer: Self-pay | Admitting: *Deleted

## 2019-05-19 DIAGNOSIS — Z780 Asymptomatic menopausal state: Secondary | ICD-10-CM

## 2019-05-19 MED ORDER — ESTRADIOL 0.05 MG/24HR TD PTTW: MEDICATED_PATCH | TRANSDERMAL | 3 refills | Status: DC

## 2019-06-07 ENCOUNTER — Ambulatory Visit: Payer: PPO | Attending: Internal Medicine

## 2019-06-07 DIAGNOSIS — Z23 Encounter for immunization: Secondary | ICD-10-CM | POA: Insufficient documentation

## 2019-06-07 NOTE — Progress Notes (Signed)
   Covid-19 Vaccination Clinic  Name:  Laura Price    MRN: [id number] DOB: 1950-09-08  06/07/2019  Ms. Chambers was observed post Covid-19 immunization for 30 minutes based on pre-vaccination screening without incidence. She was provided with Vaccine Information Sheet and instruction to access the V-Safe system.   [name] was instructed to call 911 with any severe reactions post vaccine: [name] Difficulty breathing  . Swelling of your face and throat  . A fast heartbeat  . A bad rash all over your body  . Dizziness and weakness    Immunizations Administered    Name Date Dose VIS Date Route   Pfizer COVID-19 Vaccine 06/07/2019  2:32 PM 0.3 mL 04/04/2019 Intramuscular   Manufacturer: [place]   Lot: [id number]   [place]: [id number]

## 2019-06-30 ENCOUNTER — Ambulatory Visit: Payer: PPO | Attending: Internal Medicine

## 2019-06-30 DIAGNOSIS — Z23 Encounter for immunization: Secondary | ICD-10-CM | POA: Insufficient documentation

## 2019-06-30 NOTE — Progress Notes (Signed)
   Covid-19 Vaccination Clinic  Name:  Mikiah Kobs    MRN: [id number] DOB: 1950-08-12  06/30/2019  Ms. Abdulaziz was observed post Covid-19 immunization for 30 minutes based on pre-vaccination screening without incident. She was provided with Vaccine Information Sheet and instruction to access the V-Safe system.   Ms. Alers was instructed to call 911 with any severe reactions post vaccine: [name] Difficulty breathing  . Swelling of face and throat  . A fast heartbeat  . A bad rash all over body  . Dizziness and weakness   Immunizations Administered    Name Date Dose VIS Date Route   Pfizer COVID-19 Vaccine 06/30/2019 12:43 PM 0.3 mL 04/04/2019 Intramuscular   Manufacturer: [place]   Lot: [id number]   [place]: [id number]

## 2019-07-18 DIAGNOSIS — H35371 Puckering of macula, right eye: Secondary | ICD-10-CM | POA: Diagnosis not present

## 2019-07-18 DIAGNOSIS — H2513 Age-related nuclear cataract, bilateral: Secondary | ICD-10-CM | POA: Diagnosis not present

## 2019-07-18 DIAGNOSIS — H43812 Vitreous degeneration, left eye: Secondary | ICD-10-CM | POA: Diagnosis not present

## 2019-07-21 ENCOUNTER — Other Ambulatory Visit: Payer: Self-pay

## 2019-07-21 ENCOUNTER — Ambulatory Visit: Payer: PPO | Admitting: Podiatry

## 2019-07-21 ENCOUNTER — Encounter: Payer: Self-pay | Admitting: Podiatry

## 2019-07-21 ENCOUNTER — Ambulatory Visit (INDEPENDENT_AMBULATORY_CARE_PROVIDER_SITE_OTHER): Payer: PPO

## 2019-07-21 ENCOUNTER — Other Ambulatory Visit: Payer: Self-pay | Admitting: Podiatry

## 2019-07-21 VITALS — Temp 97.3°F

## 2019-07-21 DIAGNOSIS — M722 Plantar fascial fibromatosis: Secondary | ICD-10-CM

## 2019-07-21 DIAGNOSIS — M21619 Bunion of unspecified foot: Secondary | ICD-10-CM

## 2019-07-21 DIAGNOSIS — M205X1 Other deformities of toe(s) (acquired), right foot: Secondary | ICD-10-CM | POA: Diagnosis not present

## 2019-07-21 DIAGNOSIS — M79671 Pain in right foot: Secondary | ICD-10-CM

## 2019-07-21 MED ORDER — MELOXICAM 15 MG PO TABS: 15.0000 mg | ORAL_TABLET | Freq: Every day | ORAL | 2 refills | Status: DC

## 2019-07-21 NOTE — Patient Instructions (Signed)

## 2019-07-21 NOTE — Progress Notes (Signed)
Subjective:   Patient ID: Laura Price, female   DOB: 69 y.o.   MRN: [id number]   HPI Patient presents with 3 to 25-month history of significant heel pain right and now is developing some pain on the top of the foot.  Also states that she has trouble with her big toe joint right over left and has trouble with wearing certain shoe gear on the left with bunion deformity.  Patient does not smoke likes to be active   Review of Systems  All other systems reviewed and are negative.       Objective:  Physical Exam Vitals and nursing note reviewed.  Constitutional:      Appearance: She is well-developed.  Pulmonary:     Effort: Pulmonary effort is normal.  Musculoskeletal:        General: Normal range of motion.  Skin:    General: Skin is warm.  Neurological:     Mental Status: She is alert.     Neurovascular status intact muscle strength found to be adequate range of motion subtalar midtarsal joint within normal limits.  Exquisite discomfort plantar aspect right heel at the insertional point tendon calcaneus with moderate discomfort dorsally and reduced range of motion of the first MPJ right with spur formation and left shows structural bunion with no restriction of motion.  Patient is found to have good digital perfusion well oriented x3     Assessment:  Acute fasciitis right with inflammation fluid medial band along with hallux limitus deformity right bunion deformity left     Plan:  H&P reviewed conditions and today I went ahead did sterile prep and injected the fascia right 3 mg Kenalog 5 mm Xylocaine applied fascial brace discussed hallux limitus and the consideration for a reverse Morton's orthotic long-term and also discussed bunion deformity left.  Reappoint to recheck in the next several weeks  X-rays indicate that there is spurring plantar heel and quite a bit of spurring around the big toe joint right with flattening of the joint surface narrowing of the surface  indicating moderate arthritic process

## 2019-08-06 DIAGNOSIS — H2513 Age-related nuclear cataract, bilateral: Secondary | ICD-10-CM | POA: Diagnosis not present

## 2019-08-06 DIAGNOSIS — H2512 Age-related nuclear cataract, left eye: Secondary | ICD-10-CM | POA: Diagnosis not present

## 2019-08-11 ENCOUNTER — Ambulatory Visit: Payer: PPO | Admitting: Podiatry

## 2019-08-14 ENCOUNTER — Encounter: Payer: Self-pay | Admitting: Podiatry

## 2019-08-14 ENCOUNTER — Ambulatory Visit: Payer: PPO | Admitting: Podiatry

## 2019-08-14 ENCOUNTER — Other Ambulatory Visit: Payer: Self-pay

## 2019-08-14 VITALS — Temp 97.1°F

## 2019-08-14 DIAGNOSIS — M205X1 Other deformities of toe(s) (acquired), right foot: Secondary | ICD-10-CM | POA: Diagnosis not present

## 2019-08-14 DIAGNOSIS — M722 Plantar fascial fibromatosis: Secondary | ICD-10-CM

## 2019-08-14 DIAGNOSIS — Z9189 Other specified personal risk factors, not elsewhere classified: Secondary | ICD-10-CM | POA: Insufficient documentation

## 2019-08-14 DIAGNOSIS — Z889 Allergy status to unspecified drugs, medicaments and biological substances status: Secondary | ICD-10-CM | POA: Insufficient documentation

## 2019-08-14 DIAGNOSIS — N951 Menopausal and female climacteric states: Secondary | ICD-10-CM | POA: Insufficient documentation

## 2019-08-14 DIAGNOSIS — N63 Unspecified lump in unspecified breast: Secondary | ICD-10-CM | POA: Insufficient documentation

## 2019-08-14 NOTE — Progress Notes (Signed)
Subjective:   Patient ID: Laura Price, female   DOB: 69 y.o.   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  For the hallux limitus we discussed the possibility for osteotomy or possible joint fusion for implantation depending on response and I educated her on this and different considerations

## 2019-08-19 DIAGNOSIS — H2512 Age-related nuclear cataract, left eye: Secondary | ICD-10-CM | POA: Diagnosis not present

## 2019-08-19 DIAGNOSIS — H25812 Combined forms of age-related cataract, left eye: Secondary | ICD-10-CM | POA: Diagnosis not present

## 2019-08-25 DIAGNOSIS — H2511 Age-related nuclear cataract, right eye: Secondary | ICD-10-CM | POA: Diagnosis not present

## 2019-09-02 DIAGNOSIS — H2511 Age-related nuclear cataract, right eye: Secondary | ICD-10-CM | POA: Diagnosis not present

## 2019-09-02 DIAGNOSIS — H25811 Combined forms of age-related cataract, right eye: Secondary | ICD-10-CM | POA: Diagnosis not present

## 2019-09-02 HISTORY — PX: EYE SURGERY: SHX253

## 2019-09-18 NOTE — Progress Notes (Addendum)
I connected with Laura Price today by telephone and verified that I am speaking with the correct person using two identifiers. Location patient: home Location provider: work Persons participating in the virtual visit: patient, Therapist, sports.   I discussed the limitations, risks, security and privacy concerns of performing an evaluation and management service by telephone and the availability of in person appointments. I also discussed with the patient that there may be a patient responsible charge related to this service. The patient expressed understanding and verbally consented to this telephonic visit.    Interactive audio and video telecommunications were attempted between RN and patient, however failed, due to patient having technical difficulties OR patient did not have access to video capability.  We continued and completed visit with audio only.  Some vital signs may be absent or patient reported.    Subjective:   Laura Price is a 69 y.o. female who presents for Medicare Annual (Subsequent) preventive examination.  Review of Systems:   Home Safety/Smoke Alarms: Feels safe in home. Smoke alarms in place.  Lives w/ husband in 1 story home.    Female:       Mammo-01/03/19       Dexa scan- 03/02/17       CCS-10/31/12. Recall 10 yrs.     Objective:     Vitals: Unable to assess. This visit is enabled though telemedicine due to Covid 19.   Advanced Directives 09/19/2019 03/06/2018 02/01/2017  Does Patient Have a Medical Advance Directive? Yes Yes No  Type of Paramedic of [place];Living will [place];Living will -  Does patient want to make changes to medical advance directive? No - Patient declined No - Patient declined -  Copy of [place] in Chart? Yes - validated most recent copy scanned in chart (See row information) Yes - validated most recent copy scanned in chart (See row information) -  Would patient like  information on creating a medical advance directive? - - No - Patient declined    Tobacco Social History   Tobacco Use  Smoking Status Never Smoker  Smokeless Tobacco Never Used     Counseling given: Not Answered   Clinical Intake: Pain : No/denies pain    Past Medical History:  Diagnosis Date  . Allergic rhinitis    Past Surgical History:  Procedure Laterality Date  . BREAST LUMPECTOMY Left    fibroadenoma L breast  . EYE SURGERY Bilateral 09/02/2019   cataract sx Dr.Steven Eulas [place]  . MYRINGOTOMY     Left  . TOTAL ABDOMINAL HYSTERECTOMY W/ BILATERAL SALPINGOOPHORECTOMY     Family History  Problem Relation Age of Onset  . Atrial fibrillation Mother   . Breast cancer Mother 28  . Thyroid disease Mother   . Cancer Mother 5       prostate  . Heart disease Mother        a fib  . Hypertension Mother   . Prostate cancer Father   . Hypertension Father   . Cancer Father 39       prostate  . Thyroid disease Brother   . Arthritis Brother   . Thyroid disease Sister   . Osteoarthritis Other 85  . Colon cancer Neg Hx    Social History   Socioeconomic History  . Marital status: Married    Spouse name: Not on file  . Number of children: Not on file  . Years of education: Not on file  . Highest education level:  Not on file  Occupational History  . Occupation: [employer]    Comment: PT  Tobacco Use  . Smoking status: Never Smoker  . Smokeless tobacco: Never Used  Substance and Sexual Activity  . Alcohol use: Yes    Alcohol/week: 2.0 standard drinks    Types: 2 Glasses of wine per week  . Drug use: No  . Sexual activity: Yes    Partners: Male  Other Topics Concern  . Not on file  Social History Narrative   Exercise-- walk almost everyday   Social Determinants of Health   Financial Resource Strain: Low Risk   . Difficulty of Paying Living Expenses: Not hard at all  Food Insecurity: No Food Insecurity  .  Worried About Charity fundraiser in the Last Year: Never true  . Ran Out of Food in the Last Year: Never true  Transportation Needs: No Transportation Needs  . Lack of Transportation (Medical): No  . Lack of Transportation (Non-Medical): No  Physical Activity:   . Days of Exercise per Week:   . Minutes of Exercise per Session:   Stress:   . Feeling of Stress :   Social Connections:   . Frequency of Communication with Friends and Family:   . Frequency of Social Gatherings with Friends and Family:   . Attends Religious Services:   . Active Member of Clubs or Organizations:   . Attends Archivist Meetings:   [name] Marital Status:     Outpatient Encounter Medications as of 09/19/2019  Medication Sig  . aspirin 81 MG tablet Take 81 mg by mouth daily.  . Calcium-Vitamin D-Vitamin K (VIACTIV CALCIUM PLUS D) 650-12.5-40 MG-MCG-MCG CHEW Chew 1 tablet by mouth daily.  [name] estradiol (VIVELLE-DOT) 0.05 MG/24HR patch APPLY 1 PATCH(0.05 MG) EXTERNALLY TO THE SKIN 2 TIMES A WEEK  . fluticasone (FLONASE) 50 MCG/ACT nasal spray USE 1 SPRAY INTO EACH NOSTRIL TWICE DAILY  . LOTEMAX SM 0.38 % GEL   . Multiple Vitamins-Minerals (MULTIVITAMIN GUMMIES WOMENS) CHEW Chew 2 tablets by mouth daily.  [name] PROLENSA 0.07 % SOLN INSTILL 1 DROP INTO LEFT EYE 1 DAY PRIOR TO SURGERY AND CONTINUE UNTIL ALL TAKEN  . [DISCONTINUED] meloxicam (MOBIC) 15 MG tablet Take 1 tablet (15 mg total) by mouth daily.   No facility-administered encounter medications on file as of 09/19/2019.    Activities of Daily Living In your present state of health, do you have any difficulty performing the following activities: 09/19/2019 03/06/2019  Hearing? N N  Vision? N Y  Difficulty concentrating or making decisions? N N  Walking or climbing stairs? N N  Dressing or bathing? N N  Doing errands, shopping? N N  Preparing Food and eating ? N -  Using the Toilet? N -  In the past six months, have you accidently leaked urine? N -  Do  you have problems with loss of bowel control? N -  Managing your Medications? N -  Managing your Finances? N -  Housekeeping or managing your Housekeeping? N -  Some recent data might be hidden    Patient Care Team: Carollee Herter, Alferd Apa, DO as PCP - General Karl Luke, MD as Referring Physician (Optometry)    Assessment:   This is a routine wellness examination for [facility]. Physical assessment deferred to PCP.  Exercise Activities and Dietary recommendations Current Exercise Habits: Home exercise routine;Structured exercise class, Type of exercise: walking, Frequency (Times/Week): 3, Intensity: Mild,  Exercise limited by: None identified   Diet (meal preparation, eat out, water intake, caffeinated beverages, dairy products, fruits and vegetables): in general, a "healthy" diet  , well balanced   Goals    . maintain healthy active lifestyle.       Fall Risk Fall Risk  09/19/2019 03/06/2019 02/01/2017  Falls in the past year? 0 0 No  Number falls in past yr: 0 0 -  Injury with Fall? 0 0 -  Follow up Education provided;Falls prevention discussed Falls prevention discussed -   Depression Screen PHQ 2/9 Scores 09/19/2019 03/06/2019 02/26/2018 02/01/2017  PHQ - 2 Score 0 0 0 0  PHQ- 9 Score - - 0 -     Cognitive Function Ad8 score reviewed for issues:  Issues making decisions:no  Less interest in hobbies / activities:no  Repeats questions, stories (family complaining):no  Trouble using ordinary gadgets (microwave, computer, phone):no  Forgets the month or year: no  Mismanaging finances: no  Remembering appts:no  Daily problems with thinking and/or memory:no Ad8 score is=0     MMSE - Mini Mental State Exam 02/01/2017  Orientation to time 5  Orientation to Place 5  Registration 3  Attention/ Calculation 5  Recall 3  Language- name 2 objects 2  Language- repeat 1  Language- follow 3 step command 3  Language- read & follow direction 1  Write a sentence 1    Copy design 1  Total score 30        Immunization History  Administered Date(s) Administered  . Influenza Whole 01/26/2009, 02/19/2012  . Influenza, High Dose Seasonal PF 01/15/2019  . Influenza,inj,Quad PF,6+ Mos 02/18/2013, 01/30/2014, 02/01/2017  . Influenza-Unspecified 02/19/2015, 01/23/2016, 02/12/2018  . PFIZER SARS-COV-2 Vaccination 06/07/2019, 06/30/2019  . Pneumococcal Conjugate-13 02/01/2017  . Pneumococcal Polysaccharide-23 02/26/2018  . Td 11/22/2007  . Tdap 11/22/2007  . Zoster 03/23/2014   Screening Tests Health Maintenance  Topic Date Due  . DEXA SCAN  03/03/2019  . INFLUENZA VACCINE  11/23/2019  . MAMMOGRAM  01/02/2020  . COLONOSCOPY  11/01/2022  . TETANUS/TDAP  03/06/2028  . COVID-19 Vaccine  Completed  . Hepatitis C Screening  Completed  . PNA vac Low Risk Adult  Completed       Plan:    Please schedule your next medicare wellness visit with me in 1 yr.  Continue to eat heart healthy diet (full of fruits, vegetables, whole grains, lean protein, water--limit salt, fat, and sugar intake) and increase physical activity as tolerated.  Continue doing brain stimulating activities (puzzles, reading, adult coloring books, staying active) to keep memory sharp.   I have ordered your bone density scan.   I have personally reviewed and noted the following in the patient's chart:   . Medical and social history . Use of alcohol, tobacco or illicit drugs  . Current medications and supplements . Functional ability and status . Nutritional status . Physical activity . Advanced directives . List of other physicians . Hospitalizations, surgeries, and ER visits in previous 12 months . Vitals . Screenings to include cognitive, depression, and falls . Referrals and appointments  In addition, I have reviewed and discussed with patient certain preventive protocols, quality metrics, and best practice recommendations. A written personalized care plan for preventive  services as well as general preventive health recommendations were provided to patient.     Shela Nevin, [place]  09/19/2019   Reviewed   Ann Held, DO

## 2019-09-19 ENCOUNTER — Ambulatory Visit (INDEPENDENT_AMBULATORY_CARE_PROVIDER_SITE_OTHER): Payer: PPO | Admitting: *Deleted

## 2019-09-19 ENCOUNTER — Other Ambulatory Visit: Payer: Self-pay

## 2019-09-19 ENCOUNTER — Encounter: Payer: Self-pay | Admitting: *Deleted

## 2019-09-19 DIAGNOSIS — Z780 Asymptomatic menopausal state: Secondary | ICD-10-CM

## 2019-09-19 DIAGNOSIS — Z0000 Encounter for general adult medical examination without abnormal findings: Secondary | ICD-10-CM

## 2019-09-19 NOTE — Patient Instructions (Signed)
Please schedule your next medicare wellness visit with me in 1 yr.  Continue to eat heart healthy diet (full of fruits, vegetables, whole grains, lean protein, water--limit salt, fat, and sugar intake) and increase physical activity as tolerated.  Continue doing brain stimulating activities (puzzles, reading, adult coloring books, staying active) to keep memory sharp.   I have ordered your bone density scan.    Laura Price , Thank you for taking time to come for your Medicare Wellness Visit. I appreciate your ongoing commitment to your health goals. Please review the following plan we discussed and let me know if I can assist you in the future.   These are the goals we discussed: Goals    . maintain healthy active lifestyle.       This is a list of the screening recommended for you and due dates:  Health Maintenance  Topic Date Due  . DEXA scan (bone density measurement)  03/03/2019  . Flu Shot  11/23/2019  . Mammogram  01/02/2020  . Colon Cancer Screening  11/01/2022  . Tetanus Vaccine  03/06/2028  . COVID-19 Vaccine  Completed  .  Hepatitis C: One time screening is recommended by Center for Disease Control  (CDC) for  adults born from [birth year] through 1964.   Completed  . Pneumonia vaccines  Completed    Preventive Care 68 Years and Older, Female Preventive care refers to lifestyle choices and visits with your health care provider that can promote health and wellness. This includes:  A yearly physical exam. This is also called an annual well check.  Regular dental and eye exams.  Immunizations.  Screening for certain conditions.  Healthy lifestyle choices, such as diet and exercise. What can I expect for my preventive care visit? Physical exam Your health care provider will check:  Height and weight. These may be used to calculate body mass index (BMI), which is a measurement that tells if you are at a healthy weight.  Heart rate and blood pressure.  Your skin for  abnormal spots. Counseling Your health care provider may ask you questions about:  Alcohol, tobacco, and drug use.  Emotional well-being.  Home and relationship well-being.  Sexual activity.  Eating habits.  History of falls.  Memory and ability to understand (cognition).  Work and work Statistician.  Pregnancy and menstrual history. What immunizations do I need?  Influenza (flu) vaccine  This is recommended every year. Tetanus, diphtheria, and pertussis (Tdap) vaccine  You may need a Td booster every 10 years. Varicella (chickenpox) vaccine  You may need this vaccine if you have not already been vaccinated. Zoster (shingles) vaccine  You may need this after age 39. Pneumococcal conjugate (PCV13) vaccine  One dose is recommended after age 12. Pneumococcal polysaccharide (PPSV23) vaccine  One dose is recommended after age 85. Measles, mumps, and rubella (MMR) vaccine  You may need at least one dose of MMR if you were born in 1957 or later. You may also need a second dose. Meningococcal conjugate (MenACWY) vaccine  You may need this if you have certain conditions. Hepatitis A vaccine  You may need this if you have certain conditions or if you travel or work in places where you may be exposed to hepatitis A. Hepatitis B vaccine  You may need this if you have certain conditions or if you travel or work in places where you may be exposed to hepatitis B. Haemophilus influenzae type b (Hib) vaccine  You may need this if you have  certain conditions. You may receive vaccines as individual doses or as more than one vaccine together in one shot (combination vaccines). Talk with your health care provider about the risks and benefits of combination vaccines. What tests do I need? Blood tests  Lipid and cholesterol levels. These may be checked every 5 years, or more frequently depending on your overall health.  Hepatitis C test.  Hepatitis B test. Screening  Lung  cancer screening. You may have this screening every year starting at age 46 if you have a 30-pack-year history of smoking and currently smoke or have quit within the past 15 years.  Colorectal cancer screening. All adults should have this screening starting at age 20 and continuing until age 32. Your health care provider may recommend screening at age 63 if you are at increased risk. You will have tests every 1-10 years, depending on your results and the type of screening test.  Diabetes screening. This is done by checking your blood sugar (glucose) after you have not eaten for a while (fasting). You may have this done every 1-3 years.  Mammogram. This may be done every 1-2 years. Talk with your health care provider about how often you should have regular mammograms.  BRCA-related cancer screening. This may be done if you have a family history of breast, ovarian, tubal, or peritoneal cancers. Other tests  Sexually transmitted disease (STD) testing.  Bone density scan. This is done to screen for osteoporosis. You may have this done starting at age 16. Follow these instructions at home: Eating and drinking  Eat a diet that includes fresh fruits and vegetables, whole grains, lean protein, and low-fat dairy products. Limit your intake of foods with high amounts of sugar, saturated fats, and salt.  Take vitamin and mineral supplements as recommended by your health care provider.  Do not drink alcohol if your health care provider tells you not to drink.  If you drink alcohol: ? Limit how much you have to 0-1 drink a day. ? Be aware of how much alcohol is in your drink. In the [place] one drink equals one 12 oz bottle of beer (355 mL), one 5 oz glass of wine (148 mL), or one 1 oz glass of hard liquor (44 mL). Lifestyle  Take daily care of your teeth and gums.  Stay active. Exercise for at least 30 minutes on 5 or more days each week.  Do not use any products that contain nicotine or tobacco,  such as cigarettes, e-cigarettes, and chewing tobacco. If you need help quitting, ask your health care provider.  If you are sexually active, practice safe sex. Use a condom or other form of protection in order to prevent STIs (sexually transmitted infections).  Talk with your health care provider about taking a low-dose aspirin or statin. What's next?  Go to your health care provider once a year for a well check visit.  Ask your health care provider how often you should have your eyes and teeth checked.  Stay up to date on all vaccines. This information is not intended to replace advice given to you by your health care provider. Make sure you discuss any questions you have with your health care provider. Document Revised: 04/04/2018 Document Reviewed: 04/04/2018 Elsevier Patient Education  2020 [employer].

## 2019-10-07 ENCOUNTER — Other Ambulatory Visit: Payer: Self-pay | Admitting: Family Medicine

## 2019-10-07 DIAGNOSIS — Z1231 Encounter for screening mammogram for malignant neoplasm of breast: Secondary | ICD-10-CM

## 2020-01-05 ENCOUNTER — Ambulatory Visit
Admission: RE | Admit: 2020-01-05 | Discharge: 2020-01-05 | Disposition: A | Discharge status: Home / Self Care | Payer: PPO | Source: Ambulatory Visit | Attending: Family Medicine | Admitting: Family Medicine

## 2020-01-05 ENCOUNTER — Other Ambulatory Visit: Payer: Self-pay

## 2020-01-05 DIAGNOSIS — Z780 Asymptomatic menopausal state: Secondary | ICD-10-CM

## 2020-01-05 DIAGNOSIS — M85851 Other specified disorders of bone density and structure, right thigh: Secondary | ICD-10-CM | POA: Diagnosis not present

## 2020-01-05 DIAGNOSIS — Z1231 Encounter for screening mammogram for malignant neoplasm of breast: Secondary | ICD-10-CM

## 2020-01-17 ENCOUNTER — Other Ambulatory Visit: Payer: Self-pay | Admitting: Family Medicine

## 2020-01-17 DIAGNOSIS — Z780 Asymptomatic menopausal state: Secondary | ICD-10-CM

## 2020-01-19 ENCOUNTER — Other Ambulatory Visit: Payer: PPO

## 2020-01-19 DIAGNOSIS — Z20822 Contact with and (suspected) exposure to covid-19: Secondary | ICD-10-CM | POA: Diagnosis not present

## 2020-01-21 LAB — SARS-COV-2, NAA 2 DAY TAT

## 2020-01-21 LAB — NOVEL CORONAVIRUS, NAA: SARS-CoV-2, NAA: NOT DETECTED

## 2020-01-29 ENCOUNTER — Ambulatory Visit: Payer: PPO | Attending: Internal Medicine

## 2020-01-29 DIAGNOSIS — Z23 Encounter for immunization: Secondary | ICD-10-CM

## 2020-01-29 NOTE — Progress Notes (Signed)
° °  Covid[facility]19 Vaccination Clinic  Name:  Laura Price    MRN: [id number] DOB: 06-27-1950  01/29/2020  Laura Price was observed post Covid-19 immunization for 15 minutes without incident. She was provided with Vaccine Information Sheet and instruction to access the V-Safe system.   Laura Price was instructed to call 911 with any severe reactions post vaccine:  Difficulty breathing   Swelling of face and throat   A fast heartbeat   A bad rash all over body   Dizziness and weakness

## 2020-03-11 ENCOUNTER — Ambulatory Visit (INDEPENDENT_AMBULATORY_CARE_PROVIDER_SITE_OTHER): Payer: PPO | Admitting: Family Medicine

## 2020-03-11 ENCOUNTER — Other Ambulatory Visit: Payer: Self-pay

## 2020-03-11 ENCOUNTER — Encounter: Payer: Self-pay | Admitting: Family Medicine

## 2020-03-11 VITALS — BP 132/70 | HR 63 | Temp 97.8°F | Resp 18 | Ht 65.0 in | Wt 156.8 lb

## 2020-03-11 DIAGNOSIS — Z136 Encounter for screening for cardiovascular disorders: Secondary | ICD-10-CM | POA: Diagnosis not present

## 2020-03-11 DIAGNOSIS — E559 Vitamin D deficiency, unspecified: Secondary | ICD-10-CM | POA: Diagnosis not present

## 2020-03-11 LAB — COMPREHENSIVE METABOLIC PANEL
ALT: 12 U/L (ref 0–35)
AST: 16 U/L (ref 0–37)
Albumin: 4.2 g/dL (ref 3.5–5.2)
Alkaline Phosphatase: 79 U/L (ref 39–117)
BUN: 12 mg/dL (ref 6–23)
CO2: 32 mEq/L (ref 19–32)
Calcium: 9.4 mg/dL (ref 8.4–10.5)
Chloride: 99 mEq/L (ref 96–112)
Creatinine, Ser: 0.62 mg/dL (ref 0.40–1.20)
GFR: 91.5 mL/min (ref >60.00)
Glucose, Bld: 97 mg/dL (ref 70–99)
Potassium: 4.6 mEq/L (ref 3.5–5.1)
Sodium: 136 mEq/L (ref 135–145)
Total Bilirubin: 0.6 mg/dL (ref 0.2–1.2)
Total Protein: 6.9 g/dL (ref 6.0–8.3)

## 2020-03-11 LAB — LIPID PANEL
Cholesterol: 186 mg/dL (ref 0–200)
HDL: 85.1 mg/dL (ref >39.00)
LDL Cholesterol: 92 mg/dL (ref 0–99)
NonHDL: 101.05
Total CHOL/HDL Ratio: 2
Triglycerides: 43 mg/dL (ref 0.0–149.0)
VLDL: 8.6 mg/dL (ref 0.0–40.0)

## 2020-03-11 LAB — CBC WITH DIFFERENTIAL/PLATELET
Basophils Absolute: 0.1 10*3/uL (ref 0.0–0.1)
Basophils Relative: 1 % (ref 0.0–3.0)
Eosinophils Absolute: 0.1 10*3/uL (ref 0.0–0.7)
Eosinophils Relative: 1.5 % (ref 0.0–5.0)
HCT: 42.5 % (ref 36.0–46.0)
Hemoglobin: 14.3 g/dL (ref 12.0–15.0)
Lymphocytes Relative: 18.9 % (ref 12.0–46.0)
Lymphs Abs: 1 10*3/uL (ref 0.7–4.0)
MCHC: 33.6 g/dL (ref 30.0–36.0)
MCV: 87.9 fl (ref 78.0–100.0)
Monocytes Absolute: 0.4 10*3/uL (ref 0.1–1.0)
Monocytes Relative: 8.2 % (ref 3.0–12.0)
Neutro Abs: 3.8 10*3/uL (ref 1.4–7.7)
Neutrophils Relative %: 70.4 % (ref 43.0–77.0)
Platelets: 270 10*3/uL (ref 150.0–400.0)
RBC: 4.84 Mil/uL (ref 3.87–5.11)
RDW: 14 % (ref 11.5–15.5)
WBC: 5.4 10*3/uL (ref 4.0–10.5)

## 2020-03-11 LAB — VITAMIN D 25 HYDROXY (VIT D DEFICIENCY, FRACTURES): VITD: 36.02 ng/mL (ref 30.00–100.00)

## 2020-03-11 NOTE — Patient Instructions (Signed)
Preventive Care 38 Years and Older, Female Preventive care refers to lifestyle choices and visits with your health care provider that can promote health and wellness. This includes:  A yearly physical exam. This is also called an annual well check.  Regular dental and eye exams.  Immunizations.  Screening for certain conditions.  Healthy lifestyle choices, such as diet and exercise. What can I expect for my preventive care visit? Physical exam Your health care provider will check:  Height and weight. These may be used to calculate body mass index (BMI), which is a measurement that tells if you are at a healthy weight.  Heart rate and blood pressure.  Your skin for abnormal spots. Counseling Your health care provider may ask you questions about:  Alcohol, tobacco, and drug use.  Emotional well-being.  Home and relationship well-being.  Sexual activity.  Eating habits.  History of falls.  Memory and ability to understand (cognition).  Work and work Statistician.  Pregnancy and menstrual history. What immunizations do I need?  Influenza (flu) vaccine  This is recommended every year. Tetanus, diphtheria, and pertussis (Tdap) vaccine  You may need a Td booster every 10 years. Varicella (chickenpox) vaccine  You may need this vaccine if you have not already been vaccinated. Zoster (shingles) vaccine  You may need this after age 33. Pneumococcal conjugate (PCV13) vaccine  One dose is recommended after age 33. Pneumococcal polysaccharide (PPSV23) vaccine  One dose is recommended after age 72. Measles, mumps, and rubella (MMR) vaccine  You may need at least one dose of MMR if you were born in 1957 or later. You may also need a second dose. Meningococcal conjugate (MenACWY) vaccine  You may need this if you have certain conditions. Hepatitis A vaccine  You may need this if you have certain conditions or if you travel or work in places where you may be exposed  to hepatitis A. Hepatitis B vaccine  You may need this if you have certain conditions or if you travel or work in places where you may be exposed to hepatitis B. Haemophilus influenzae type b (Hib) vaccine  You may need this if you have certain conditions. You may receive vaccines as individual doses or as more than one vaccine together in one shot (combination vaccines). Talk with your health care provider about the risks and benefits of combination vaccines. What tests do I need? Blood tests  Lipid and cholesterol levels. These may be checked every 5 years, or more frequently depending on your overall health.  Hepatitis C test.  Hepatitis B test. Screening  Lung cancer screening. You may have this screening every year starting at age 39 if you have a 30-pack-year history of smoking and currently smoke or have quit within the past 15 years.  Colorectal cancer screening. All adults should have this screening starting at age 36 and continuing until age 15. Your health care provider may recommend screening at age 23 if you are at increased risk. You will have tests every 1-10 years, depending on your results and the type of screening test.  Diabetes screening. This is done by checking your blood sugar (glucose) after you have not eaten for a while (fasting). You may have this done every 1-3 years.  Mammogram. This may be done every 1-2 years. Talk with your health care provider about how often you should have regular mammograms.  BRCA-related cancer screening. This may be done if you have a family history of breast, ovarian, tubal, or peritoneal cancers.  Other tests  Sexually transmitted disease (STD) testing.  Bone density scan. This is done to screen for osteoporosis. You may have this done starting at age 44. Follow these instructions at home: Eating and drinking  Eat a diet that includes fresh fruits and vegetables, whole grains, lean protein, and low-fat dairy products. Limit  your intake of foods with high amounts of sugar, saturated fats, and salt.  Take vitamin and mineral supplements as recommended by your health care provider.  Do not drink alcohol if your health care provider tells you not to drink.  If you drink alcohol: ? Limit how much you have to 0-1 drink a day. ? Be aware of how much alcohol is in your drink. In the [place] one drink equals one 12 oz bottle of beer (355 mL), one 5 oz glass of wine (148 mL), or one 1 oz glass of hard liquor (44 mL). Lifestyle  Take daily care of your teeth and gums.  Stay active. Exercise for at least 30 minutes on 5 or more days each week.  Do not use any products that contain nicotine or tobacco, such as cigarettes, e-cigarettes, and chewing tobacco. If you need help quitting, ask your health care provider.  If you are sexually active, practice safe sex. Use a condom or other form of protection in order to prevent STIs (sexually transmitted infections).  Talk with your health care provider about taking a low-dose aspirin or statin. What's next?  Go to your health care provider once a year for a well check visit.  Ask your health care provider how often you should have your eyes and teeth checked.  Stay up to date on all vaccines. This information is not intended to replace advice given to you by your health care provider. Make sure you discuss any questions you have with your health care provider. Document Revised: 04/04/2018 Document Reviewed: 04/04/2018 Elsevier Patient Education  2020 [employer].

## 2020-03-11 NOTE — Progress Notes (Signed)
Subjective:     Laura Price is a 69 y.o. female and is here for a comprehensive physical exam. The patient reports problems - hot flashes.  she stopped the hrt in August.  .  Social History   Socioeconomic History  . Marital status: Married    Spouse name: Not on file  . Number of children: Not on file  . Years of education: Not on file  . Highest education level: Not on file  Occupational History  . Occupation: [place]: [employer]    Comment: PT  Tobacco Use  . Smoking status: Never Smoker  . Smokeless tobacco: Never Used  Substance and Sexual Activity  . Alcohol use: Yes    Alcohol/week: 2.0 standard drinks    Types: 2 Glasses of wine per week  . Drug use: No  . Sexual activity: Yes    Partners: Male  Other Topics Concern  . Not on file  Social History Narrative   Exercise-- walk almost everyday   Social Determinants of Health   Financial Resource Strain: Low Risk   . Difficulty of Paying Living Expenses: Not hard at all  Food Insecurity: No Food Insecurity  . Worried About Charity fundraiser in the Last Year: Never true  . Ran Out of Food in the Last Year: Never true  Transportation Needs: No Transportation Needs  . Lack of Transportation (Medical): No  . Lack of Transportation (Non-Medical): No  Physical Activity:   . Days of Exercise per Week: Not on file  . Minutes of Exercise per Session: Not on file  Stress:   . Feeling of Stress : Not on file  Social Connections:   . Frequency of Communication with Friends and Family: Not on file  . Frequency of Social Gatherings with Friends and Family: Not on file  . Attends Religious Services: Not on file  . Active Member of Clubs or Organizations: Not on file  . Attends Archivist Meetings: Not on file  . Marital Status: Not on file  Intimate Partner Violence:   . Fear of Current or Ex-Partner: Not on file  . Emotionally Abused: Not on file  .  Physically Abused: Not on file  . Sexually Abused: Not on file   Health Maintenance  Topic Date Due  . MAMMOGRAM  01/04/2021  . DEXA SCAN  01/04/2022  . COLONOSCOPY  11/01/2022  . TETANUS/TDAP  03/06/2028  . INFLUENZA VACCINE  Completed  . COVID-19 Vaccine  Completed  . Hepatitis C Screening  Completed  . PNA vac Low Risk Adult  Completed    The following portions of the patient's history were reviewed and updated as appropriate:  She  has a past medical history of Allergic rhinitis. She does not have any pertinent problems on file. She  has a past surgical history that includes Total abdominal hysterectomy w/ bilateral salpingoophorectomy; Myringotomy; Breast lumpectomy (Left); and Eye surgery (Bilateral, 09/02/2019, 08/18/2019). Her family history includes Arthritis in her brother; Atrial fibrillation in her mother; Breast cancer (age of onset: 8) in her mother; Cancer (age of onset: 39) in her father and mother; Heart disease in her mother; Hypertension in her father and mother; Osteoarthritis (age of onset: 68) in an other family member; Prostate cancer in her father; Thyroid disease in her brother, mother, and sister. She  reports that she has never smoked. She has never used smokeless tobacco. She reports current alcohol use of about 2.0 standard  drinks of alcohol per week. She reports that she does not use drugs. She has a current medication list which includes the following prescription(s): aspirin, calcium-vitamin d-vitamin k, fluticasone, and multivitamin gummies womens. Current Outpatient Medications on File Prior to Visit  Medication Sig Dispense Refill  . aspirin 81 MG tablet Take 81 mg by mouth daily.    . Calcium-Vitamin D-Vitamin K (VIACTIV CALCIUM PLUS D) 650-12.5-40 MG-MCG-MCG CHEW Chew 1 tablet by mouth daily.    . fluticasone (FLONASE) 50 MCG/ACT nasal spray USE 1 SPRAY INTO EACH NOSTRIL TWICE DAILY 16 g 5  . Multiple Vitamins-Minerals (MULTIVITAMIN GUMMIES WOMENS) CHEW  Chew 2 tablets by mouth daily.     No current facility-administered medications on file prior to visit.   She is allergic to sulfonamide derivatives..  Review of Systems Review of Systems  Constitutional: Negative for activity change, appetite change and fatigue.  HENT: Negative for hearing loss, congestion, tinnitus and ear discharge.  dentist q27m Eyes: Negative for visual disturbance (see optho q1y -- vision corrected to 20/20 with glasses).  Respiratory: Negative for cough, chest tightness and shortness of breath.   Cardiovascular: Negative for chest pain, palpitations and leg swelling.  Gastrointestinal: Negative for abdominal pain, diarrhea, constipation and abdominal distention.  Genitourinary: Negative for urgency, frequency, decreased urine volume and difficulty urinating.  Musculoskeletal: Negative for back pain, arthralgias and gait problem.  Skin: Negative for color change, pallor and rash.  Neurological: Negative for dizziness, light-headedness, numbness and headaches.  Hematological: Negative for adenopathy. Does not bruise/bleed easily.  Psychiatric/Behavioral: Negative for suicidal ideas, confusion, sleep disturbance, self-injury, dysphoric mood, decreased concentration and agitation.       Objective:    BP 132/70 (BP Location: Right Arm, Patient Position: Sitting, Cuff Size: Normal)   Pulse 63   Temp 97.8 F (36.6 C) (Oral)   Resp 18   Ht 5\' 5"  (1.651 m)   Wt 156 lb 12.8 oz (71.1 kg)   SpO2 99%   BMI 26.09 kg/m  General appearance: alert, cooperative, appears stated age and no distress Head: Normocephalic, without obvious abnormality, atraumatic Eyes: negative findings: lids and lashes normal, conjunctivae and sclerae normal and pupils equal, round, reactive to light and accomodation Ears: normal TM's and external ear canals both ears Neck: no adenopathy, no carotid bruit, no JVD, supple, symmetrical, trachea midline and thyroid not enlarged, symmetric, no  tenderness/mass/nodules Back: symmetric, no curvature. ROM normal. No CVA tenderness. Lungs: clear to auscultation bilaterally Breasts: normal appearance, no masses or tenderness Heart: regular rate and rhythm, S1, S2 normal, no murmur, click, rub or gallop Abdomen: soft, non-tender; bowel sounds normal; no masses,  no organomegaly Pelvic: not indicated; status post hysterectomy, negative ROS Extremities: extremities normal, atraumatic, no cyanosis or edema Pulses: 2+ and symmetric Skin: Skin color, texture, turgor normal. No rashes or lesions Lymph nodes: Cervical, supraclavicular, and axillary nodes normal. Neurologic: Alert and oriented X 3, normal strength and tone. Normal symmetric reflexes. Normal coordination and gait    Assessment:    Healthy female exam.      Plan:      ghm utd Check labs  See After Visit Summary for Counseling Recommendations    1. Vitamin D deficiency Check labs  - Comprehensive metabolic panel - CBC with Differential/Platelet - Vitamin D (25 hydroxy)  2. Ischemic heart disease screen   - Lipid panel - Comprehensive metabolic panel - CBC with Differential/Platelet

## 2020-04-19 DIAGNOSIS — H26491 Other secondary cataract, right eye: Secondary | ICD-10-CM | POA: Diagnosis not present

## 2020-04-19 DIAGNOSIS — H35371 Puckering of macula, right eye: Secondary | ICD-10-CM | POA: Diagnosis not present

## 2020-04-19 DIAGNOSIS — H43812 Vitreous degeneration, left eye: Secondary | ICD-10-CM | POA: Diagnosis not present

## 2020-06-18 DIAGNOSIS — D2261 Melanocytic nevi of right upper limb, including shoulder: Secondary | ICD-10-CM | POA: Diagnosis not present

## 2020-06-18 DIAGNOSIS — L814 Other melanin hyperpigmentation: Secondary | ICD-10-CM | POA: Diagnosis not present

## 2020-06-18 DIAGNOSIS — D225 Melanocytic nevi of trunk: Secondary | ICD-10-CM | POA: Diagnosis not present

## 2020-06-18 DIAGNOSIS — D485 Neoplasm of uncertain behavior of skin: Secondary | ICD-10-CM | POA: Diagnosis not present

## 2020-06-18 DIAGNOSIS — L738 Other specified follicular disorders: Secondary | ICD-10-CM | POA: Diagnosis not present

## 2020-06-18 DIAGNOSIS — D2262 Melanocytic nevi of left upper limb, including shoulder: Secondary | ICD-10-CM | POA: Diagnosis not present

## 2020-06-18 DIAGNOSIS — D2239 Melanocytic nevi of other parts of face: Secondary | ICD-10-CM | POA: Diagnosis not present

## 2020-06-18 DIAGNOSIS — L570 Actinic keratosis: Secondary | ICD-10-CM | POA: Diagnosis not present

## 2020-06-18 DIAGNOSIS — L821 Other seborrheic keratosis: Secondary | ICD-10-CM | POA: Diagnosis not present

## 2020-06-18 DIAGNOSIS — D1801 Hemangioma of skin and subcutaneous tissue: Secondary | ICD-10-CM | POA: Diagnosis not present

## 2020-09-27 ENCOUNTER — Ambulatory Visit (INDEPENDENT_AMBULATORY_CARE_PROVIDER_SITE_OTHER): Payer: PPO

## 2020-09-27 VITALS — Ht 66.0 in | Wt 156.0 lb

## 2020-09-27 DIAGNOSIS — Z0000 Encounter for general adult medical examination without abnormal findings: Secondary | ICD-10-CM | POA: Diagnosis not present

## 2020-09-27 NOTE — Progress Notes (Signed)
Subjective:   Laura Price is a 70 y.o. female who presents for Medicare Annual (Subsequent) preventive examination.  I connected with Dao today by telephone and verified that I am speaking with the correct person using two identifiers. Location patient: home Location provider: work Persons participating in the virtual visit: patient, Marine scientist.    I discussed the limitations, risks, security and privacy concerns of performing an evaluation and management service by telephone and the availability of in person appointments. I also discussed with the patient that there may be a patient responsible charge related to this service. The patient expressed understanding and verbally consented to this telephonic visit.    Interactive audio and video telecommunications were attempted between this provider and patient, however failed, due to patient having technical difficulties OR patient did not have access to video capability.  We continued and completed visit with audio only.  Some vital signs may be absent or patient reported.   Time Spent with patient on telephone encounter: 20 minutes   Review of Systems     Cardiac Risk Factors include: advanced age (>12men, >10 women)     Objective:    Today's Vitals   09/27/20 1022  Weight: 156 lb (70.8 kg)  Height: 5\' 6"  (1.676 m)   Body mass index is 25.18 kg/m.  Advanced Directives 09/27/2020 09/19/2019 03/06/2018 02/01/2017  Does Patient Have a Medical Advance Directive? Yes Yes Yes No  Type of Paramedic of [place];Living will [place];Living will [place];Living will -  Does patient want to make changes to medical advance directive? - No - Patient declined No - Patient declined -  Copy of [place] in Chart? Yes - validated most recent copy scanned in chart (See row information) Yes - validated most recent copy scanned in chart (See row  information) Yes - validated most recent copy scanned in chart (See row information) -  Would patient like information on creating a medical advance directive? - - - No - Patient declined    Current Medications (verified) Outpatient Encounter Medications as of 09/27/2020  Medication Sig  . aspirin 81 MG tablet Take 81 mg by mouth daily.  . Calcium-Vitamin D-Vitamin K 650-12.5-40 MG-MCG-MCG CHEW Chew 1 tablet by mouth daily.  . fluticasone (FLONASE) 50 MCG/ACT nasal spray USE 1 SPRAY INTO EACH NOSTRIL TWICE DAILY  . Multiple Vitamins-Minerals (MULTIVITAMIN GUMMIES WOMENS) CHEW Chew 2 tablets by mouth daily.   No facility-administered encounter medications on file as of 09/27/2020.    Allergies (verified) Sulfonamide derivatives   History: Past Medical History:  Diagnosis Date  . Allergic rhinitis    Past Surgical History:  Procedure Laterality Date  . BREAST LUMPECTOMY Left    fibroadenoma L breast  . EYE SURGERY Bilateral 09/02/2019, 08/18/2019   cataract sx Dr.Steven Eulas Post     . MYRINGOTOMY     Left  . TOTAL ABDOMINAL HYSTERECTOMY W/ BILATERAL SALPINGOOPHORECTOMY     Family History  Problem Relation Age of Onset  . Atrial fibrillation Mother   . Breast cancer Mother 58  . Thyroid disease Mother   . Cancer Mother 30       prostate  . Heart disease Mother        a fib  . Hypertension Mother   . Prostate cancer Father   . Hypertension Father   . Cancer Father 43       prostate  . Thyroid disease Brother   . Arthritis  Brother   . Thyroid disease Sister   . Osteoarthritis Other 85  . Colon cancer Neg Hx    Social History   Socioeconomic History  . Marital status: Married    Spouse name: Not on file  . Number of children: Not on file  . Years of education: Not on file  . Highest education level: Not on file  Occupational History  . Occupation: [place]: [employer]    Comment: PT  Tobacco Use  . Smoking status: Never  Smoker  . Smokeless tobacco: Never Used  Substance and Sexual Activity  . Alcohol use: Yes    Alcohol/week: 2.0 standard drinks    Types: 2 Glasses of wine per week  . Drug use: No  . Sexual activity: Yes    Partners: Male  Other Topics Concern  . Not on file  Social History Narrative   Exercise-- walk almost everyday   Social Determinants of Health   Financial Resource Strain: Low Risk   . Difficulty of Paying Living Expenses: Not hard at all  Food Insecurity: No Food Insecurity  . Worried About Charity fundraiser in the Last Year: Never true  . Ran Out of Food in the Last Year: Never true  Transportation Needs: No Transportation Needs  . Lack of Transportation (Medical): No  . Lack of Transportation (Non-Medical): No  Physical Activity: Sufficiently Active  . Days of Exercise per Week: 4 days  . Minutes of Exercise per Session: 40 min  Stress: No Stress Concern Present  . Feeling of Stress : Not at all  Social Connections: Socially Integrated  . Frequency of Communication with Friends and Family: More than three times a week  . Frequency of Social Gatherings with Friends and Family: More than three times a week  . Attends Religious Services: More than 4 times per year  . Active Member of Clubs or Organizations: Yes  . Attends Archivist Meetings: More than 4 times per year  . Marital Status: Married    Tobacco Counseling Counseling given: Not Answered   Clinical Intake:  Pre-visit preparation completed: Yes  Pain : No/denies pain     Nutritional Status: BMI 25 -29 Overweight Nutritional Risks: None Diabetes: No  How often do you need to have someone help you when you read instructions, pamphlets, or other written materials from your doctor or pharmacy?: 1 - Never  Diabetic?No  Interpreter Needed?: No  Information entered by :: Caroleen Hamman LPN   Activities of Daily Living In your present state of health, do you have any difficulty  performing the following activities: 09/27/2020  Hearing? N  Vision? N  Difficulty concentrating or making decisions? N  Walking or climbing stairs? N  Dressing or bathing? N  Doing errands, shopping? N  Preparing Food and eating ? N  Using the Toilet? N  In the past six months, have you accidently leaked urine? N  Do you have problems with loss of bowel control? N  Managing your Medications? N  Managing your Finances? N  Housekeeping or managing your Housekeeping? N  Some recent data might be hidden    Patient Care Team: Carollee Herter, Alferd Apa, DO as PCP - General Karl Luke, MD as Referring Physician (Optometry) Regal, Tamala Fothergill, DPM as Consulting Physician (Podiatry) Calvert Cantor, MD as Consulting Physician (Ophthalmology)  Indicate any recent Medical Services you may have received from other than [facility] providers in the past year (date  may be approximate).     Assessment:   This is a routine wellness examination for [facility].  Hearing/Vision screen  Hearing Screening   125Hz  250Hz  500Hz  1000Hz  2000Hz  3000Hz  4000Hz  6000Hz  8000Hz   Right ear:           Left ear:           Comments: No issues  Vision Screening Comments: Last eye exam-03/2020-Dr. Eulas Post Reading glasses  Dietary issues and exercise activities discussed: Current Exercise Habits: Home exercise routine, Type of exercise: walking (water aerobics), Time (Minutes): 40, Frequency (Times/Week): 4, Weekly Exercise (Minutes/Week): 160, Intensity: Mild, Exercise limited by: None identified  Goals Addressed            This Visit's Progress   . maintain healthy active lifestyle.   On track     Depression Screen PHQ 2/9 Scores 09/27/2020 09/19/2019 03/06/2019 02/26/2018 02/01/2017 12/05/2012  PHQ - 2 Score 0 0 0 0 0 0  PHQ- 9 Score - - - 0 - -    Fall Risk Fall Risk  09/27/2020 09/19/2019 03/06/2019 02/01/2017  Falls in the past year? 0 0 0 No  Number falls in past yr: 0 0 0 -  Injury with Fall? 0 0 0 -  Follow up  Falls prevention discussed Education provided;Falls prevention discussed Falls prevention discussed -    FALL RISK PREVENTION PERTAINING TO THE HOME:  Any stairs in or around the home? No  Home free of loose throw rugs in walkways, pet beds, electrical cords, etc? Yes  Adequate lighting in your home to reduce risk of falls? Yes   ASSISTIVE DEVICES UTILIZED TO PREVENT FALLS:  Life alert? No  Use of a cane, walker or w/c? No  Grab bars in the bathroom? Yes  Shower chair or bench in shower? No  Elevated toilet seat or a handicapped toilet? No   TIMED UP AND GO:  Was the test performed? No . Phone visit   Cognitive Function:Normal cognitive status assessed by  this Nurse Health Advisor. No abnormalities found.   MMSE - Mini Mental State Exam 02/01/2017  Orientation to time 5  Orientation to Place 5  Registration 3  Attention/ Calculation 5  Recall 3  Language- name 2 objects 2  Language- repeat 1  Language- follow 3 step command 3  Language- read & follow direction 1  Write a sentence 1  Copy design 1  Total score 30        Immunizations Immunization History  Administered Date(s) Administered  . Influenza Whole 01/26/2009, 02/19/2012  . Influenza, High Dose Seasonal PF 01/15/2019  . Influenza,inj,Quad PF,6+ Mos 02/18/2013, 01/30/2014, 02/01/2017  . Influenza-Unspecified 01/23/2016, 02/12/2018, 01/26/2020  . PFIZER(Purple Top)SARS-COV-2 Vaccination 06/07/2019, 06/30/2019, 01/29/2020  . Pneumococcal Conjugate-13 02/01/2017  . Pneumococcal Polysaccharide-23 02/26/2018  . Td 11/22/2007  . Tdap 11/22/2007  . Zoster, Live 03/23/2014    TDAP status: Up to date  Flu Vaccine status: Up to date  Pneumococcal vaccine status: Up to date  Covid-19 vaccine status: Completed vaccines  Qualifies for Shingles Vaccine? Yes   Zostavax completed Yes   Shingrix Completed?: No.    Education has been provided regarding the importance of this vaccine. Patient has been advised to  call insurance company to determine out of pocket expense if they have not yet received this vaccine. Advised may also receive vaccine at local pharmacy or Health Dept. Verbalized acceptance and understanding.  Screening Tests Health Maintenance  Topic Date Due  . Zoster Vaccines- Shingrix (1 of  2) Never done  . INFLUENZA VACCINE  11/22/2020  . MAMMOGRAM  01/04/2021  . DEXA SCAN  01/04/2022  . COLONOSCOPY (Pts 45-34yrs Insurance coverage will need to be confirmed)  11/01/2022  . TETANUS/TDAP  03/06/2028  . COVID-19 Vaccine  Completed  . Hepatitis C Screening  Completed  . PNA vac Low Risk Adult  Completed  . Pneumococcal Vaccine 49-38 Years old  Aged Out  . HPV VACCINES  Aged Out    Health Maintenance  Health Maintenance Due  Topic Date Due  . Zoster Vaccines- Shingrix (1 of 2) Never done    Colorectal cancer screening: Type of screening: Colonoscopy. Completed 10/31/2012. Repeat every 10 years  Mammogram status: Completed BIlateral 01/05/2020. Repeat every year  Bone Density status: Completed 01/05/2020. Results reflect: Bone density results: OSTEOPENIA. Repeat every 2 years.  Lung Cancer Screening: (Low Dose CT Chest recommended if Age 58-80 years, 30 pack-year currently smoking OR have quit w/in 15years.) does not qualify.     Additional Screening:  Hepatitis C Screening:Completed-12/13/2015  Vision Screening: Recommended annual ophthalmology exams for early detection of glaucoma and other disorders of the eye. Is the patient up to date with their annual eye exam?  Yes  Who is the provider or what is the name of the office in which the patient attends annual eye exams? Dr. Eulas Post    Dental Screening: Recommended annual dental exams for proper oral hygiene  Community Resource Referral / Chronic Care Management: CRR required this visit?  No   CCM required this visit?  No      Plan:     I have personally reviewed and noted the following in the patient's chart:    . Medical and social history . Use of alcohol, tobacco or illicit drugs  . Current medications and supplements including opioid prescriptions.  . Functional ability and status . Nutritional status . Physical activity . Advanced directives . List of other physicians . Hospitalizations, surgeries, and ER visits in previous 12 months . Vitals . Screenings to include cognitive, depression, and falls . Referrals and appointments  In addition, I have reviewed and discussed with patient certain preventive protocols, quality metrics, and best practice recommendations. A written personalized care plan for preventive services as well as general preventive health recommendations were provided to patient.   Due to this being a telephonic visit, the after visit summary with patients personalized plan was offered to patient via mail or my-chart.  Patient would like to access on my-chart    Marta Antu, LPN   0/04/270  Nurse Health Advisor  Nurse Notes: None

## 2020-09-27 NOTE — Patient Instructions (Signed)
Laura Price , Thank you for taking time to complete your Medicare Wellness Visit. I appreciate your ongoing commitment to your health goals. Please review the following plan we discussed and let me know if I can assist you in the future.   Screening recommendations/referrals: Colonoscopy: Completed 10/31/2012-Due 11/01/2022 Mammogram: Completed 01/05/2020-Due 01/04/2021 Bone Density: Completed 01/05/2020-Due 01/04/2022 Recommended yearly ophthalmology/optometry visit for glaucoma screening and checkup Recommended yearly dental visit for hygiene and checkup  Vaccinations: Influenza vaccine: Up to date Pneumococcal vaccine: Up to date Tdap vaccine: Up to date-Due 2029 Shingles vaccine: Discuss with pharmacy   Covid-19:Up to date  Advanced directives: Copy in chart  Conditions/risks identified: See problem list  Next appointment: Follow up in one year for your annual wellness visit 09/29/2021 @ 11:00   Preventive Care 69 Years and Older, Female Preventive care refers to lifestyle choices and visits with your health care provider that can promote health and wellness. What does preventive care include?  A yearly physical exam. This is also called an annual well check.  Dental exams once or twice a year.  Routine eye exams. Ask your health care provider how often you should have your eyes checked.  Personal lifestyle choices, including:  Daily care of your teeth and gums.  Regular physical activity.  Eating a healthy diet.  Avoiding tobacco and drug use.  Limiting alcohol use.  Practicing safe sex.  Taking low-dose aspirin every day.  Taking vitamin and mineral supplements as recommended by your health care provider. What happens during an annual well check? The services and screenings done by your health care provider during your annual well check will depend on your age, overall health, lifestyle risk factors, and family history of disease. Counseling  Your health care  provider may ask you questions about your:  Alcohol use.  Tobacco use.  Drug use.  Emotional well-being.  Home and relationship well-being.  Sexual activity.  Eating habits.  History of falls.  Memory and ability to understand (cognition).  Work and work Statistician.  Reproductive health. Screening  You may have the following tests or measurements:  Height, weight, and BMI.  Blood pressure.  Lipid and cholesterol levels. These may be checked every 5 years, or more frequently if you are over 60 years old.  Skin check.  Lung cancer screening. You may have this screening every year starting at age 22 if you have a 30-pack-year history of smoking and currently smoke or have quit within the past 15 years.  Fecal occult blood test (FOBT) of the stool. You may have this test every year starting at age 38.  Flexible sigmoidoscopy or colonoscopy. You may have a sigmoidoscopy every 5 years or a colonoscopy every 10 years starting at age 23.  Hepatitis C blood test.  Hepatitis B blood test.  Sexually transmitted disease (STD) testing.  Diabetes screening. This is done by checking your blood sugar (glucose) after you have not eaten for a while (fasting). You may have this done every 1-3 years.  Bone density scan. This is done to screen for osteoporosis. You may have this done starting at age 88.  Mammogram. This may be done every 1-2 years. Talk to your health care provider about how often you should have regular mammograms. Talk with your health care provider about your test results, treatment options, and if necessary, the need for more tests. Vaccines  Your health care provider may recommend certain vaccines, such as:  Influenza vaccine. This is recommended every year.  Tetanus,  diphtheria, and acellular pertussis (Tdap, Td) vaccine. You may need a Td booster every 10 years.  Zoster vaccine. You may need this after age 15.  Pneumococcal 13-valent conjugate (PCV13)  vaccine. One dose is recommended after age 3.  Pneumococcal polysaccharide (PPSV23) vaccine. One dose is recommended after age 75. Talk to your health care provider about which screenings and vaccines you need and how often you need them. This information is not intended to replace advice given to you by your health care provider. Make sure you discuss any questions you have with your health care provider. Document Released: 05/07/2015 Document Revised: 12/29/2015 Document Reviewed: 02/09/2015 Elsevier Interactive Patient Education  2017 [employer] Prevention in the Home Falls can cause injuries. They can happen to people of all ages. There are many things you can do to make your home safe and to help prevent falls. What can I do on the outside of my home?  Regularly fix the edges of walkways and driveways and fix any cracks.  Remove anything that might make you trip as you walk through a door, such as a raised step or threshold.  Trim any bushes or trees on the path to your home.  Use bright outdoor lighting.  Clear any walking paths of anything that might make someone trip, such as rocks or tools.  Regularly check to see if handrails are loose or broken. Make sure that both sides of any steps have handrails.  Any raised decks and porches should have guardrails on the edges.  Have any leaves, snow, or ice cleared regularly.  Use sand or salt on walking paths during winter.  Clean up any spills in your garage right away. This includes oil or grease spills. What can I do in the bathroom?  Use night lights.  Install grab bars by the toilet and in the tub and shower. Do not use towel bars as grab bars.  Use non-skid mats or decals in the tub or shower.  If you need to sit down in the shower, use a plastic, non-slip stool.  Keep the floor dry. Clean up any water that spills on the floor as soon as it happens.  Remove soap buildup in the tub or shower  regularly.  Attach bath mats securely with double-sided non-slip rug tape.  Do not have throw rugs and other things on the floor that can make you trip. What can I do in the bedroom?  Use night lights.  Make sure that you have a light by your bed that is easy to reach.  Do not use any sheets or blankets that are too big for your bed. They should not hang down onto the floor.  Have a firm chair that has side arms. You can use this for support while you get dressed.  Do not have throw rugs and other things on the floor that can make you trip. What can I do in the kitchen?  Clean up any spills right away.  Avoid walking on wet floors.  Keep items that you use a lot in easy-to-reach places.  If you need to reach something above you, use a strong step stool that has a grab bar.  Keep electrical cords out of the way.  Do not use floor polish or wax that makes floors slippery. If you must use wax, use non-skid floor wax.  Do not have throw rugs and other things on the floor that can make you trip. What can I do with  my stairs?  Do not leave any items on the stairs.  Make sure that there are handrails on both sides of the stairs and use them. Fix handrails that are broken or loose. Make sure that handrails are as long as the stairways.  Check any carpeting to make sure that it is firmly attached to the stairs. Fix any carpet that is loose or worn.  Avoid having throw rugs at the top or bottom of the stairs. If you do have throw rugs, attach them to the floor with carpet tape.  Make sure that you have a light switch at the top of the stairs and the bottom of the stairs. If you do not have them, ask someone to add them for you. What else can I do to help prevent falls?  Wear shoes that:  Do not have high heels.  Have rubber bottoms.  Are comfortable and fit you well.  Are closed at the toe. Do not wear sandals.  If you use a stepladder:  Make sure that it is fully  opened. Do not climb a closed stepladder.  Make sure that both sides of the stepladder are locked into place.  Ask someone to hold it for you, if possible.  Clearly mark and make sure that you can see:  Any grab bars or handrails.  First and last steps.  Where the edge of each step is.  Use tools that help you move around (mobility aids) if they are needed. These include:  Canes.  Walkers.  Scooters.  Crutches.  Turn on the lights when you go into a dark area. Replace any light bulbs as soon as they burn out.  Set up your furniture so you have a clear path. Avoid moving your furniture around.  If any of your floors are uneven, fix them.  If there are any pets around you, be aware of where they are.  Review your medicines with your doctor. Some medicines can make you feel dizzy. This can increase your chance of falling. Ask your doctor what other things that you can do to help prevent falls. This information is not intended to replace advice given to you by your health care provider. Make sure you discuss any questions you have with your health care provider. Document Released: 02/04/2009 Document Revised: 09/16/2015 Document Reviewed: 05/15/2014 Elsevier Interactive Patient Education  2017 [employer].

## 2020-12-09 ENCOUNTER — Other Ambulatory Visit: Payer: Self-pay | Admitting: Family Medicine

## 2020-12-09 DIAGNOSIS — Z1231 Encounter for screening mammogram for malignant neoplasm of breast: Secondary | ICD-10-CM

## 2021-01-07 ENCOUNTER — Other Ambulatory Visit: Payer: Self-pay

## 2021-01-07 ENCOUNTER — Ambulatory Visit
Admission: RE | Admit: 2021-01-07 | Discharge: 2021-01-07 | Disposition: A | Discharge status: Home / Self Care | Payer: PPO | Source: Ambulatory Visit | Attending: Family Medicine | Admitting: Family Medicine

## 2021-01-07 DIAGNOSIS — Z1231 Encounter for screening mammogram for malignant neoplasm of breast: Secondary | ICD-10-CM

## 2021-01-20 DIAGNOSIS — H43812 Vitreous degeneration, left eye: Secondary | ICD-10-CM | POA: Diagnosis not present

## 2021-01-20 DIAGNOSIS — H26493 Other secondary cataract, bilateral: Secondary | ICD-10-CM | POA: Diagnosis not present

## 2021-01-20 DIAGNOSIS — H35371 Puckering of macula, right eye: Secondary | ICD-10-CM | POA: Diagnosis not present

## 2021-03-14 ENCOUNTER — Encounter: Payer: Self-pay | Admitting: Family Medicine

## 2021-03-14 ENCOUNTER — Other Ambulatory Visit: Payer: Self-pay

## 2021-03-14 ENCOUNTER — Ambulatory Visit (INDEPENDENT_AMBULATORY_CARE_PROVIDER_SITE_OTHER): Payer: PPO | Admitting: Family Medicine

## 2021-03-14 VITALS — BP 130/80 | HR 77 | Temp 97.9°F | Resp 18 | Ht 65.0 in | Wt 161.4 lb

## 2021-03-14 DIAGNOSIS — Z136 Encounter for screening for cardiovascular disorders: Secondary | ICD-10-CM | POA: Diagnosis not present

## 2021-03-14 DIAGNOSIS — R002 Palpitations: Secondary | ICD-10-CM

## 2021-03-14 DIAGNOSIS — Z0000 Encounter for general adult medical examination without abnormal findings: Secondary | ICD-10-CM | POA: Diagnosis not present

## 2021-03-14 LAB — CBC WITH DIFFERENTIAL/PLATELET
Basophils Absolute: 0.1 10*3/uL (ref 0.0–0.1)
Basophils Relative: 1.2 % (ref 0.0–3.0)
Eosinophils Absolute: 0.1 10*3/uL (ref 0.0–0.7)
Eosinophils Relative: 0.9 % (ref 0.0–5.0)
HCT: 40.8 % (ref 36.0–46.0)
Hemoglobin: 13.3 g/dL (ref 12.0–15.0)
Lymphocytes Relative: 21.7 % (ref 12.0–46.0)
Lymphs Abs: 1.4 10*3/uL (ref 0.7–4.0)
MCHC: 32.5 g/dL (ref 30.0–36.0)
MCV: 87.7 fl (ref 78.0–100.0)
Monocytes Absolute: 0.5 10*3/uL (ref 0.1–1.0)
Monocytes Relative: 8.1 % (ref 3.0–12.0)
Neutro Abs: 4.4 10*3/uL (ref 1.4–7.7)
Neutrophils Relative %: 68.1 % (ref 43.0–77.0)
Platelets: 295 10*3/uL (ref 150.0–400.0)
RBC: 4.66 Mil/uL (ref 3.87–5.11)
RDW: 14.1 % (ref 11.5–15.5)
WBC: 6.4 10*3/uL (ref 4.0–10.5)

## 2021-03-14 LAB — LIPID PANEL
Cholesterol: 194 mg/dL (ref 0–200)
HDL: 84.3 mg/dL (ref >39.00)
LDL Cholesterol: 99 mg/dL (ref 0–99)
NonHDL: 109.76
Total CHOL/HDL Ratio: 2
Triglycerides: 55 mg/dL (ref 0.0–149.0)
VLDL: 11 mg/dL (ref 0.0–40.0)

## 2021-03-14 LAB — TSH: TSH: 1.51 u[IU]/mL (ref 0.35–5.50)

## 2021-03-14 LAB — COMPREHENSIVE METABOLIC PANEL
ALT: 13 U/L (ref 0–35)
AST: 16 U/L (ref 0–37)
Albumin: 4.4 g/dL (ref 3.5–5.2)
Alkaline Phosphatase: 103 U/L (ref 39–117)
BUN: 11 mg/dL (ref 6–23)
CO2: 28 mEq/L (ref 19–32)
Calcium: 9.4 mg/dL (ref 8.4–10.5)
Chloride: 98 mEq/L (ref 96–112)
Creatinine, Ser: 0.59 mg/dL (ref 0.40–1.20)
GFR: 91.95 mL/min (ref >60.00)
Glucose, Bld: 92 mg/dL (ref 70–99)
Potassium: 4.3 mEq/L (ref 3.5–5.1)
Sodium: 134 mEq/L — ABNORMAL LOW (ref 135–145)
Total Bilirubin: 0.5 mg/dL (ref 0.2–1.2)
Total Protein: 7 g/dL (ref 6.0–8.3)

## 2021-03-14 LAB — VITAMIN B12: Vitamin B-12: 744 pg/mL (ref 211–911)

## 2021-03-14 NOTE — Progress Notes (Signed)
Subjective:            Laura Price is a 70 y.o. female and is here for a comprehensive physical exam. The patient reports no problems.  Social History   Socioeconomic History   Marital status: Married    Spouse name: Not on file   Number of children: Not on file   Years of education: Not on file   Highest education level: Not on file  Occupational History   Occupation: [employer]    Employer: [employer]   Occupation: retired    Comment: 2018  Tobacco Use   Smoking status: Never   Smokeless tobacco: Never  Substance and Sexual Activity   Alcohol use: Yes    Alcohol/week: 2.0 standard drinks    Types: 2 Glasses of wine per week   Drug use: No   Sexual activity: Yes    Partners: Male  Other Topics Concern   Not on file  Social History Narrative   Exercise-- walk almost everyday   Social Determinants of Health   Financial Resource Strain: Low Risk    Difficulty of Paying Living Expenses: Not hard at all  Food Insecurity: No Food Insecurity   Worried About Charity fundraiser in the Last Year: Never true   [place] in the Last Year: Never true  Transportation Needs: No Transportation Needs   Lack of Transportation (Medical): No   Lack of Transportation (Non-Medical): No  Physical Activity: Sufficiently Active   Days of Exercise per Week: 4 days   Minutes of Exercise per Session: 40 min  Stress: No Stress Concern Present   Feeling of Stress : Not at all  Social Connections: Socially Integrated   Frequency of Communication with Friends and Family: More than three times a week   Frequency of Social Gatherings with Friends and Family: More than three times a week   Attends Religious Services: More than 4 times per year   Active Member of [employer] or Organizations: Yes   Attends Music therapist: More than 4 times per year   Marital Status: Married  Human resources officer Violence: Not At Risk   Fear  of Current or Ex-Partner: No   Emotionally Abused: No   Physically Abused: No   Sexually Abused: No   Health Maintenance  Topic Date Due   Zoster Vaccines- Shingrix (1 of 2) Never done   DEXA SCAN  01/04/2022   MAMMOGRAM  01/07/2022   COLONOSCOPY (Pts 45-30yrs Insurance coverage will need to be confirmed)  11/01/2022   TETANUS/TDAP  03/06/2028   Pneumonia Vaccine 53+ Years old  Completed   INFLUENZA VACCINE  Completed   COVID-19 Vaccine  Completed   Hepatitis C Screening  Completed   HPV VACCINES  Aged Out    The following portions of the patient's history were reviewed and updated as appropriate: She  has a past medical history of Allergic rhinitis. She does not have any pertinent problems on file. She  has a past surgical history that includes Total abdominal hysterectomy w/ bilateral salpingoophorectomy; Myringotomy; Breast lumpectomy (Left); and Eye surgery (Bilateral, 09/02/2019, 08/18/2019). Her family history includes Arthritis in her brother; Atrial fibrillation in her mother; Breast cancer (age of onset: 1) in her mother; Cancer (age of onset: 88) in her father and mother; Heart disease in her mother; Hypertension in her father and mother; Osteoarthritis (age of onset: 55) in an other family member; Prostate cancer in  her father; Thyroid disease in her brother, mother, and sister. She  reports that she has never smoked. She has never used smokeless tobacco. She reports current alcohol use of about 2.0 standard drinks per week. She reports that she does not use drugs. She has a current medication list which includes the following prescription(s): aspirin, calcium-vitamin d-vitamin k, fluticasone, and multivitamin gummies womens. Current Outpatient Medications on File Prior to Visit  Medication Sig Dispense Refill   aspirin 81 MG tablet Take 81 mg by mouth daily.     Calcium-Vitamin D-Vitamin K 650-12.5-40 MG-MCG-MCG CHEW Chew 1 tablet by mouth daily.     fluticasone (FLONASE) 50  MCG/ACT nasal spray USE 1 SPRAY INTO EACH NOSTRIL TWICE DAILY 16 g 5   Multiple Vitamins-Minerals (MULTIVITAMIN GUMMIES WOMENS) CHEW Chew 2 tablets by mouth daily.     No current facility-administered medications on file prior to visit.   She is allergic to sulfonamide derivatives..  Review of Systems Review of Systems  Constitutional: Negative for activity change, appetite change and fatigue.  HENT: Negative for hearing loss, congestion, tinnitus and ear discharge.  dentist q39m Eyes: Negative for visual disturbance (see optho q1y -- vision corrected to 20/20 with glasses).  Respiratory: Negative for cough, chest tightness and shortness of breath.   Cardiovascular: Negative for chest pain, palpitations and leg swelling.  Gastrointestinal: Negative for abdominal pain, diarrhea, constipation and abdominal distention.  Genitourinary: Negative for urgency, frequency, decreased urine volume and difficulty urinating.  Musculoskeletal: Negative for back pain, arthralgias and gait problem.  Skin: Negative for color change, pallor and rash.  Neurological: Negative for dizziness, light-headedness, numbness and headaches.  Hematological: Negative for adenopathy. Does not bruise/bleed easily.  Psychiatric/Behavioral: Negative for suicidal ideas, confusion, sleep disturbance, self-injury, dysphoric mood, decreased concentration and agitation.      Objective:    BP 130/80 (BP Location: Left Arm, Patient Position: Sitting, Cuff Size: Normal)   Pulse 77   Temp 97.9 F (36.6 C) (Oral)   Resp 18   Ht 5\' 5"  (1.651 m)   Wt 161 lb 6.4 oz (73.2 kg)   SpO2 100%   BMI 26.86 kg/m  General appearance: alert, cooperative, appears stated age, and no distress Head: Normocephalic, without obvious abnormality, atraumatic Eyes: negative findings: lids and lashes normal, conjunctivae and sclerae normal, and pupils equal, round, reactive to light and accomodation Ears: normal TM's and external ear canals both  ears Neck: no adenopathy, no carotid bruit, no JVD, supple, symmetrical, trachea midline, and thyroid not enlarged, symmetric, no tenderness/mass/nodules Back: symmetric, no curvature. ROM normal. No CVA tenderness. Lungs: clear to auscultation bilaterally Heart: regular rate and rhythm, S1, S2 normal, no murmur, click, rub or gallop Abdomen: soft, non-tender; bowel sounds normal; no masses,  no organomegaly Pelvic: not indicated; status post hysterectomy, negative ROS Extremities: extremities normal, atraumatic, no cyanosis or edema Pulses: 2+ and symmetric Skin: Skin color, texture, turgor normal. No rashes or lesions Lymph nodes: Cervical, supraclavicular, and axillary nodes normal. Neurologic: Alert and oriented X 3, normal strength and tone. Normal symmetric reflexes. Normal coordination and gait    Assessment:    Healthy female exam.      Plan:    Ghm utd Check labs  See After Visit Summary for Counseling Recommendations

## 2021-03-14 NOTE — Assessment & Plan Note (Signed)
ghm utd Check labs  See avs  

## 2021-03-14 NOTE — Patient Instructions (Signed)
Preventive Care 40 Years and Older, Female Preventive care refers to lifestyle choices and visits with your health care provider that can promote health and wellness. Preventive care visits are also called wellness exams. What can I expect for my preventive care visit? Counseling Your health care provider may ask you questions about your: Medical history, including: Past medical problems. Family medical history. Pregnancy and menstrual history. History of falls. Current health, including: Memory and ability to understand (cognition). Emotional well-being. Home life and relationship well-being. Sexual activity and sexual health. Lifestyle, including: Alcohol, nicotine or tobacco, and drug use. Access to firearms. Diet, exercise, and sleep habits. Work and work Statistician. Sunscreen use. Safety issues such as seatbelt and bike helmet use. Physical exam Your health care provider will check your: Height and weight. These may be used to calculate your BMI (body mass index). BMI is a measurement that tells if you are at a healthy weight. Waist circumference. This measures the distance around your waistline. This measurement also tells if you are at a healthy weight and may help predict your risk of certain diseases, such as type 2 diabetes and high blood pressure. Heart rate and blood pressure. Body temperature. Skin for abnormal spots. What immunizations do I need? Vaccines are usually given at various ages, according to a schedule. Your health care provider will recommend vaccines for you based on your age, medical history, and lifestyle or other factors, such as travel or where you work. What tests do I need? Screening Your health care provider may recommend screening tests for certain conditions. This may include: Lipid and cholesterol levels. Hepatitis C test. Hepatitis B test. HIV (human immunodeficiency virus) test. STI (sexually transmitted infection) testing, if you are at  risk. Lung cancer screening. Colorectal cancer screening. Diabetes screening. This is done by checking your blood sugar (glucose) after you have not eaten for a while (fasting). Mammogram. Talk with your health care provider about how often you should have regular mammograms. BRCA-related cancer screening. This may be done if you have a family history of breast, ovarian, tubal, or peritoneal cancers. Bone density scan. This is done to screen for osteoporosis. Talk with your health care provider about your test results, treatment options, and if necessary, the need for more tests. Follow these instructions at home: Eating and drinking  Eat a diet that includes fresh fruits and vegetables, whole grains, lean protein, and low-fat dairy products. Limit your intake of foods with high amounts of sugar, saturated fats, and salt. Take vitamin and mineral supplements as recommended by your health care provider. Do not drink alcohol if your health care provider tells you not to drink. If you drink alcohol: Limit how much you have to 0-1 drink a day. Know how much alcohol is in your drink. In the [place] one drink equals one 12 oz bottle of beer (355 mL), one 5 oz glass of wine (148 mL), or one 1 oz glass of hard liquor (44 mL). Lifestyle Brush your teeth every morning and night with fluoride toothpaste. Floss one time each day. Exercise for at least 30 minutes 5 or more days each week. Do not use any products that contain nicotine or tobacco. These products include cigarettes, chewing tobacco, and vaping devices, such as e-cigarettes. If you need help quitting, ask your health care provider. Do not use drugs. If you are sexually active, practice safe sex. Use a condom or other form of protection in order to prevent STIs. Take aspirin only as told by your  health care provider. Make sure that you understand how much to take and what form to take. Work with your health care provider to find out whether it  is safe and beneficial for you to take aspirin daily. Ask your health care provider if you need to take a cholesterol-lowering medicine (statin). Find healthy ways to manage stress, such as: Meditation, yoga, or listening to music. Journaling. Talking to a trusted person. Spending time with friends and family. Minimize exposure to UV radiation to reduce your risk of skin cancer. Safety Always wear your seat belt while driving or riding in a vehicle. Do not drive: If you have been drinking alcohol. Do not ride with someone who has been drinking. When you are tired or distracted. While texting. If you have been using any mind-altering substances or drugs. Wear a helmet and other protective equipment during sports activities. If you have firearms in your house, make sure you follow all gun safety procedures. What's next? Visit your health care provider once a year for an annual wellness visit. Ask your health care provider how often you should have your eyes and teeth checked. Stay up to date on all vaccines. This information is not intended to replace advice given to you by your health care provider. Make sure you discuss any questions you have with your health care provider. Document Revised: 10/06/2020 Document Reviewed: 10/06/2020 Elsevier Patient Education  [place].

## 2021-06-21 DIAGNOSIS — L570 Actinic keratosis: Secondary | ICD-10-CM | POA: Diagnosis not present

## 2021-06-21 DIAGNOSIS — L821 Other seborrheic keratosis: Secondary | ICD-10-CM | POA: Diagnosis not present

## 2021-06-21 DIAGNOSIS — L239 Allergic contact dermatitis, unspecified cause: Secondary | ICD-10-CM | POA: Diagnosis not present

## 2021-06-21 DIAGNOSIS — I8391 Asymptomatic varicose veins of right lower extremity: Secondary | ICD-10-CM | POA: Diagnosis not present

## 2021-06-21 DIAGNOSIS — B078 Other viral warts: Secondary | ICD-10-CM | POA: Diagnosis not present

## 2021-06-21 DIAGNOSIS — L438 Other lichen planus: Secondary | ICD-10-CM | POA: Diagnosis not present

## 2021-06-21 DIAGNOSIS — D2239 Melanocytic nevi of other parts of face: Secondary | ICD-10-CM | POA: Diagnosis not present

## 2021-07-28 DIAGNOSIS — H43812 Vitreous degeneration, left eye: Secondary | ICD-10-CM | POA: Diagnosis not present

## 2021-07-28 DIAGNOSIS — H35371 Puckering of macula, right eye: Secondary | ICD-10-CM | POA: Diagnosis not present

## 2021-07-28 DIAGNOSIS — H26493 Other secondary cataract, bilateral: Secondary | ICD-10-CM | POA: Diagnosis not present

## 2021-09-29 ENCOUNTER — Ambulatory Visit: Payer: PPO

## 2021-10-03 ENCOUNTER — Ambulatory Visit (INDEPENDENT_AMBULATORY_CARE_PROVIDER_SITE_OTHER): Payer: PPO

## 2021-10-03 VITALS — BP 148/76 | HR 64 | Temp 98.2°F | Resp 16 | Ht 65.0 in | Wt 165.0 lb

## 2021-10-03 DIAGNOSIS — Z0000 Encounter for general adult medical examination without abnormal findings: Secondary | ICD-10-CM | POA: Diagnosis not present

## 2021-10-03 NOTE — Progress Notes (Signed)
Subjective:   Laura Price is a 71 y.o. female who presents for Medicare Annual (Subsequent) preventive examination.  I connected with  Lylee Corrow on [birth year] by a audio enabled telemedicine application and verified that I am speaking with the correct person using two identifiers.  Patient Location: Home  Provider Location: Office/Clinic  I discussed the limitations of evaluation and management by telemedicine. The patient expressed understanding and agreed to proceed.   Review of Systems     Cardiac Risk Factors include: advanced age (>23mn, >>55women)     Objective:    Today's Vitals   10/03/21 1429  BP: (!) 148/76  Pulse: 64  Resp: 16  Temp: 98.2 F (36.8 C)  SpO2: 98%  Weight: 165 lb (74.8 kg)  Height: '5\' 5"'$  (1.651 m)   Body mass index is 27.46 kg/m.     10/03/2021    2:31 PM 09/27/2020   10:24 AM 09/19/2019   10:13 AM 03/06/2018    8:34 AM 02/01/2017    3:55 PM  Advanced Directives  Does Patient Have a Medical Advance Directive? Yes Yes Yes Yes No  Type of [place] of [place] DNR (pink MOST or yellow form);Living will [place] will [place] will [place] will   Does patient want to make changes to medical advance directive? No - Patient declined  No - Patient declined No - Patient declined   Copy of [place] Chart? Yes - validated most recent copy scanned in chart (See row information) Yes - validated most recent copy scanned in chart (See row information) Yes - validated most recent copy scanned in chart (See row information) Yes - validated most recent copy scanned in chart (See row information)   Would patient like information on creating a medical advance directive?     No - Patient declined    Current Medications (verified) Outpatient Encounter Medications as of 10/03/2021  Medication Sig    aspirin 81 MG tablet Take 81 mg by mouth daily.   Calcium-Vitamin D-Vitamin K 650-12.5-40 MG-MCG-MCG CHEW Chew 1 tablet by mouth daily.   fluticasone (FLONASE) 50 MCG/ACT nasal spray USE 1 SPRAY INTO EACH NOSTRIL TWICE DAILY   Multiple Vitamins-Minerals (MULTIVITAMIN GUMMIES WOMENS) CHEW Chew 2 tablets by mouth daily.   No facility-administered encounter medications on file as of 10/03/2021.    Allergies (verified) Sulfonamide derivatives   History: Past Medical History:  Diagnosis Date   Allergic rhinitis    Past Surgical History:  Procedure Laterality Date   BREAST LUMPECTOMY Left    fibroadenoma L breast   EYE SURGERY Bilateral 09/02/2019, 08/18/2019   cataract sx Dr.Steven GEulas Post     MYRINGOTOMY     Left   TOTAL ABDOMINAL HYSTERECTOMY W/ BILATERAL SALPINGOOPHORECTOMY     Family History  Problem Relation Age of Onset   Atrial fibrillation Mother    Breast cancer Mother 740  Thyroid disease Mother    Cancer Mother 622      prostate   Heart disease Mother        a fib   Hypertension Mother    Prostate cancer Father    Hypertension Father    Cancer Father 660      prostate   Thyroid disease Brother    Arthritis Brother    Thyroid disease Sister    Osteoarthritis Other 820  Colon cancer Neg Hx  Social History   Socioeconomic History   Marital status: Married    Spouse name: Not on file   Number of children: Not on file   Years of education: Not on file   Highest education level: Not on file  Occupational History   Occupation: [place]    Employer: [employer]: [employer]   Occupation: retired    Comment: 2018  Tobacco Use   Smoking status: Never   Smokeless tobacco: Never  Substance and Sexual Activity   Alcohol use: Yes    Alcohol/week: 2.0 standard drinks of alcohol    Types: 2 Glasses of wine per week   Drug use: No   Sexual activity: Yes    Partners: Male  Other Topics Concern   Not on file  Social History  Narrative   Exercise-- walk almost everyday   Social Determinants of Health   Financial Resource Strain: Low Risk  (09/27/2020)   Overall Financial Resource Strain (CARDIA)    Difficulty of Paying Living Expenses: Not hard at all  Food Insecurity: No Food Insecurity (09/27/2020)   Hunger Vital Sign    Worried About Running Out of Food in the Last Year: Never true    [place] in the Last Year: Never true  Transportation Needs: No Transportation Needs (09/27/2020)   PRAPARE - Hydrologist (Medical): No    Lack of Transportation (Non-Medical): No  Physical Activity: Sufficiently Active (09/27/2020)   Exercise Vital Sign    Days of Exercise per Week: 4 days    Minutes of Exercise per Session: 40 min  Stress: No Stress Concern Present (09/27/2020)   [place]    Feeling of Stress : Not at all  Social Connections: [place] (09/27/2020)   Social Connection and Isolation Panel [NHANES]    Frequency of Communication with Friends and Family: More than three times a week    Frequency of Social Gatherings with Friends and Family: More than three times a week    Attends Religious Services: More than 4 times per year    Active Member of [employer] or Organizations: Yes    Attends Music therapist: More than 4 times per year    Marital Status: Married    Tobacco Counseling Counseling given: Not Answered   Clinical Intake:  Pre-visit preparation completed: Yes  Pain : No/denies pain     BMI - recorded: 27.46 Nutritional Status: BMI 25 -29 Overweight Nutritional Risks: None Diabetes: No  How often do you need to have someone help you when you read instructions, pamphlets, or other written materials from your doctor or pharmacy?: 1 - Never  Diabetic?no  Interpreter Needed?: No  Information entered by :: Doreather Hoxworth   Activities of Daily Living    10/03/2021     2:35 PM  In your present state of health, do you have any difficulty performing the following activities:  Hearing? 0  Vision? 0  Difficulty concentrating or making decisions? 0  Walking or climbing stairs? 0  Dressing or bathing? 0  Doing errands, shopping? 0  Preparing Food and eating ? N  Using the Toilet? N  In the past six months, have you accidently leaked urine? Y  Do you have problems with loss of bowel control? N  Managing your Medications? N  Managing your Finances? N  Housekeeping or managing your Housekeeping? N    Patient  Care Team: Carollee Herter, Alferd Apa, DO as PCP - General Regal, Tamala Fothergill, DPM as Consulting Physician (Podiatry) Ulla Gallo, MD as Consulting Physician (Dermatology) Lonia Skinner, MD as Consulting Physician (Ophthalmology)  Indicate any recent Medical Services you may have received from other than [facility] providers in the past year (date may be approximate).     Assessment:   This is a routine wellness examination for [facility].  Hearing/Vision screen No results found.  Dietary issues and exercise activities discussed: Current Exercise Habits: Home exercise routine, Type of exercise: walking, Time (Minutes): 60, Frequency (Times/Week): 7, Weekly Exercise (Minutes/Week): 420, Intensity: Mild, Exercise limited by: None identified   Goals Addressed             This Visit's Progress    maintain healthy active lifestyle.   On track      Depression Screen    10/03/2021    2:33 PM 09/27/2020   10:27 AM 09/19/2019   10:15 AM 03/06/2019    8:46 AM 02/26/2018    5:33 PM 02/01/2017    3:20 PM 12/05/2012   10:45 AM  PHQ 2/9 Scores  PHQ - 2 Score 0 0 0 0 0 0 0  PHQ- 9 Score     0      Fall Risk    10/03/2021    2:32 PM 09/27/2020   10:26 AM 09/19/2019   10:15 AM 03/06/2019    9:06 AM 02/01/2017    3:20 PM  [place] in the past year? 0 0 0 0 No  Number falls in past yr: 0 0 0 0   Injury with Fall? 0 0 0 0   Risk for fall due  to : No Fall Risks      Follow up Falls evaluation completed Falls prevention discussed Education provided;Falls prevention discussed Falls prevention discussed     FALL RISK PREVENTION PERTAINING TO THE HOME:  Any stairs in or around the home? No  If so, are there any without handrails?  N/a Home free of loose throw rugs in walkways, pet beds, electrical cords, etc? Yes  Adequate lighting in your home to reduce risk of falls? Yes   ASSISTIVE DEVICES UTILIZED TO PREVENT FALLS:  Life alert? No  Use of a cane, walker or w/c? No  Grab bars in the bathroom? Yes  Shower chair or bench in shower? No  Elevated toilet seat or a handicapped toilet? No   TIMED UP AND GO:  Was the test performed? Yes .  Length of time to ambulate 10 feet: 9 sec.   Gait slow and steady with assistive device  Cognitive Function:    02/01/2017    3:51 PM  MMSE - Mini Mental State Exam  Orientation to time 5  Orientation to Place 5  Registration 3  Attention/ Calculation 5  Recall 3  Language- name 2 objects 2  Language- repeat 1  Language- follow 3 step command 3  Language- read & follow direction 1  Write a sentence 1  Copy design 1  Total score 30        10/03/2021    2:36 PM  6CIT Screen  What Year? 0 points  What month? 0 points  What time? 0 points  Count back from 20 0 points  Months in reverse 0 points  Repeat phrase 0 points  Total Score 0 points    Immunizations Immunization History  Administered Date(s) Administered   Influenza Whole 01/26/2009, 02/19/2012  Influenza, High Dose Seasonal PF 01/15/2019   Influenza,inj,Quad PF,6+ Mos 02/18/2013, 01/30/2014, 02/01/2017   Influenza-Unspecified 01/23/2016, 02/12/2018, 01/26/2020, 01/17/2021   Moderna Covid-19 Vaccine Bivalent Booster 33yr & up 01/17/2021   PFIZER(Purple Top)SARS-COV-2 Vaccination 06/07/2019, 06/30/2019, 01/29/2020   Pneumococcal Conjugate-13 02/01/2017   Pneumococcal Polysaccharide-23 02/26/2018   Td  11/22/2007   Tdap 11/22/2007   Zoster Recombinat (Shingrix) 12/21/2020, 03/03/2021   Zoster, Live 03/23/2014    TDAP status: Up to date  Flu Vaccine status: Up to date  Pneumococcal vaccine status: Up to date  Covid-19 vaccine status: Completed vaccines  Qualifies for Shingles Vaccine? Yes   Zostavax completed No   Shingrix completed Yes  Screening Tests Health Maintenance  Topic Date Due   COVID-19 Vaccine (5 - Pfizer series) 05/19/2021   INFLUENZA VACCINE  11/22/2021   DEXA SCAN  01/04/2022   MAMMOGRAM  01/07/2022   COLONOSCOPY (Pts 45-462yrInsurance coverage will need to be confirmed)  11/01/2022   TETANUS/TDAP  03/06/2028   Pneumonia Vaccine 6516Years old  Completed   Hepatitis C Screening  Completed   Zoster Vaccines- Shingrix  Completed   HPV VACCINES  Aged Out    Health Maintenance  Health Maintenance Due  Topic Date Due   COVID-19 Vaccine (5 - Pfizer series) 05/19/2021    Colorectal cancer screening: Type of screening: Colonoscopy. Completed 10/31/12. Repeat every 10 years  Mammogram status: Completed 01/07/21. Repeat every year  Bone Density status: Completed 01/05/20. Results reflect: Bone density results: OSTEOPENIA. Repeat every 2 years.  Lung Cancer Screening: (Low Dose CT Chest recommended if Age 70-80ears, 30 pack-year currently smoking OR have quit w/in 15years.) does not qualify.   Lung Cancer Screening Referral: N/A  Additional Screening:  Hepatitis C Screening: does qualify; Completed 12/13/2015  Vision Screening: Recommended annual ophthalmology exams for early detection of glaucoma and other disorders of the eye. Is the patient up to date with their annual eye exam?  Yes  Who is the provider or what is the name of the office in which the patient attends annual eye exams? [facility] If pt is not established with a provider, would they like to be referred to a provider to establish care? No .   Dental Screening: Recommended annual  dental exams for proper oral hygiene  Community Resource Referral / Chronic Care Management: CRR required this visit?  No   CCM required this visit?  No      Plan:     I have personally reviewed and noted the following in the patient's chart:   Medical and social history Use of alcohol, tobacco or illicit drugs  Current medications and supplements including opioid prescriptions.  Functional ability and status Nutritional status Physical activity Advanced directives List of other physicians Hospitalizations, surgeries, and ER visits in previous 12 months Vitals Screenings to include cognitive, depression, and falls Referrals and appointments  In addition, I have reviewed and discussed with patient certain preventive protocols, quality metrics, and best practice recommendations. A written personalized care plan for preventive services as well as general preventive health recommendations were provided to patient.     ShDuard Bradyhism, CMKaukauna 10/03/2021   Nurse Notes: none

## 2021-10-03 NOTE — Patient Instructions (Signed)
Laura Price , Thank you for taking time to come for your Medicare Wellness Visit. I appreciate your ongoing commitment to your health goals. Please review the following plan we discussed and let me know if I can assist you in the future.   Screening recommendations/referrals: Colonoscopy: 10/31/12 due 11/01/22 Mammogram: 01/07/21 due 01/07/22 Bone Density: 913/21 due 01/04/22 Recommended yearly ophthalmology/optometry visit for glaucoma screening and checkup Recommended yearly dental visit for hygiene and checkup  Vaccinations: Influenza vaccine: up to date Pneumococcal vaccine: up to date Tdap vaccine: up to date Shingles vaccine: up to date   Covid-19:completed  Advanced directives: yes, on file  Conditions/risks identified: see problem list  Next appointment: Follow up in one year for your annual wellness visit    Preventive Care 70 Years and Older, Female Preventive care refers to lifestyle choices and visits with your health care provider that can promote health and wellness. What does preventive care include? A yearly physical exam. This is also called an annual well check. Dental exams once or twice a year. Routine eye exams. Ask your health care provider how often you should have your eyes checked. Personal lifestyle choices, including: Daily care of your teeth and gums. Regular physical activity. Eating a healthy diet. Avoiding tobacco and drug use. Limiting alcohol use. Practicing safe sex. Taking low-dose aspirin every day. Taking vitamin and mineral supplements as recommended by your health care provider. What happens during an annual well check? The services and screenings done by your health care provider during your annual well check will depend on your age, overall health, lifestyle risk factors, and family history of disease. Counseling  Your health care provider may ask you questions about your: Alcohol use. Tobacco use. Drug use. Emotional  well-being. Home and relationship well-being. Sexual activity. Eating habits. History of falls. Memory and ability to understand (cognition). Work and work Statistician. Reproductive health. Screening  You may have the following tests or measurements: Height, weight, and BMI. Blood pressure. Lipid and cholesterol levels. These may be checked every 5 years, or more frequently if you are over 3 years old. Skin check. Lung cancer screening. You may have this screening every year starting at age 70 if you have a 30-pack-year history of smoking and currently smoke or have quit within the past 15 years. Fecal occult blood test (FOBT) of the stool. You may have this test every year starting at age 70. Flexible sigmoidoscopy or colonoscopy. You may have a sigmoidoscopy every 5 years or a colonoscopy every 10 years starting at age 70. Hepatitis C blood test. Hepatitis B blood test. Sexually transmitted disease (STD) testing. Diabetes screening. This is done by checking your blood sugar (glucose) after you have not eaten for a while (fasting). You may have this done every 1-3 years. Bone density scan. This is done to screen for osteoporosis. You may have this done starting at age 40. Mammogram. This may be done every 1-2 years. Talk to your health care provider about how often you should have regular mammograms. Talk with your health care provider about your test results, treatment options, and if necessary, the need for more tests. Vaccines  Your health care provider may recommend certain vaccines, such as: Influenza vaccine. This is recommended every year. Tetanus, diphtheria, and acellular pertussis (Tdap, Td) vaccine. You may need a Td booster every 10 years. Zoster vaccine. You may need this after age 66. Pneumococcal 13-valent conjugate (PCV13) vaccine. One dose is recommended after age 48. Pneumococcal polysaccharide (PPSV23) vaccine. One  dose is recommended after age 70. Talk to your  health care provider about which screenings and vaccines you need and how often you need them. This information is not intended to replace advice given to you by your health care provider. Make sure you discuss any questions you have with your health care provider. Document Released: 05/07/2015 Document Revised: 12/29/2015 Document Reviewed: 02/09/2015 Elsevier Interactive Patient Education  2017 [place] Prevention in the Home Falls can cause injuries. They can happen to people of all ages. There are many things you can do to make your home safe and to help prevent falls. What can I do on the outside of my home? Regularly fix the edges of walkways and driveways and fix any cracks. Remove anything that might make you trip as you walk through a door, such as a raised step or threshold. Trim any bushes or trees on the path to your home. Use bright outdoor lighting. Clear any walking paths of anything that might make someone trip, such as rocks or tools. Regularly check to see if handrails are loose or broken. Make sure that both sides of any steps have handrails. Any raised decks and porches should have guardrails on the edges. Have any leaves, snow, or ice cleared regularly. Use sand or salt on walking paths during winter. Clean up any spills in your garage right away. This includes oil or grease spills. What can I do in the bathroom? Use night lights. Install grab bars by the toilet and in the tub and shower. Do not use towel bars as grab bars. Use non-skid mats or decals in the tub or shower. If you need to sit down in the shower, use a plastic, non-slip stool. Keep the floor dry. Clean up any water that spills on the floor as soon as it happens. Remove soap buildup in the tub or shower regularly. Attach bath mats securely with double-sided non-slip rug tape. Do not have throw rugs and other things on the floor that can make you trip. What can I do in the bedroom? Use night  lights. Make sure that you have a light by your bed that is easy to reach. Do not use any sheets or blankets that are too big for your bed. They should not hang down onto the floor. Have a firm chair that has side arms. You can use this for support while you get dressed. Do not have throw rugs and other things on the floor that can make you trip. What can I do in the kitchen? Clean up any spills right away. Avoid walking on wet floors. Keep items that you use a lot in easy-to-reach places. If you need to reach something above you, use a strong step stool that has a grab bar. Keep electrical cords out of the way. Do not use floor polish or wax that makes floors slippery. If you must use wax, use non-skid floor wax. Do not have throw rugs and other things on the floor that can make you trip. What can I do with my stairs? Do not leave any items on the stairs. Make sure that there are handrails on both sides of the stairs and use them. Fix handrails that are broken or loose. Make sure that handrails are as long as the stairways. Check any carpeting to make sure that it is firmly attached to the stairs. Fix any carpet that is loose or worn. Avoid having throw rugs at the top or bottom of the  stairs. If you do have throw rugs, attach them to the floor with carpet tape. Make sure that you have a light switch at the top of the stairs and the bottom of the stairs. If you do not have them, ask someone to add them for you. What else can I do to help prevent falls? Wear shoes that: Do not have high heels. Have rubber bottoms. Are comfortable and fit you well. Are closed at the toe. Do not wear sandals. If you use a stepladder: Make sure that it is fully opened. Do not climb a closed stepladder. Make sure that both sides of the stepladder are locked into place. Ask someone to hold it for you, if possible. Clearly mark and make sure that you can see: Any grab bars or handrails. First and last  steps. Where the edge of each step is. Use tools that help you move around (mobility aids) if they are needed. These include: Canes. Walkers. Scooters. Crutches. Turn on the lights when you go into a dark area. Replace any light bulbs as soon as they burn out. Set up your furniture so you have a clear path. Avoid moving your furniture around. If any of your floors are uneven, fix them. If there are any pets around you, be aware of where they are. Review your medicines with your doctor. Some medicines can make you feel dizzy. This can increase your chance of falling. Ask your doctor what other things that you can do to help prevent falls. This information is not intended to replace advice given to you by your health care provider. Make sure you discuss any questions you have with your health care provider. Document Released: 02/04/2009 Document Revised: 09/16/2015 Document Reviewed: 05/15/2014 Elsevier Interactive Patient Education  2017 [employer].

## 2021-10-11 NOTE — Progress Notes (Signed)
Subjective:   Laura Price is a 71 y.o. female who presents for Medicare Annual (Subsequent) preventive examination.    I discussed the limitations of evaluation and management by telemedicine. The patient expressed understanding and agreed to proceed.   Review of Systems     Cardiac Risk Factors include: advanced age (>16mn, >>26women)     Objective:    Today's Vitals   10/03/21 1429  BP: (!) 148/76  Pulse: 64  Resp: 16  Temp: 98.2 F (36.8 C)  SpO2: 98%  Weight: 165 lb (74.8 kg)  Height: '5\' 5"'$  (1.651 m)   Body mass index is 27.46 kg/m.     10/03/2021    2:31 PM 09/27/2020   10:24 AM 09/19/2019   10:13 AM 03/06/2018    8:34 AM 02/01/2017    3:55 PM  Advanced Directives  Does Patient Have a Medical Advance Directive? Yes Yes Yes Yes No  Type of AParamedicof [place] of facility DNR (pink MOST or yellow form);Living will HMorseLiving will H[place]inookLiving will HJenn[place]Living will   Does patient want to make changes to medical advance directive? No - Patient declined  No - Patient declined No - Patient declined   Copy of HBitter [place]in Chart? Yes - validated most recent copy scanned in chart (See row information) Yes - validated most recent copy scanned in chart (See row information) Yes - validated most recent copy scanned in chart (See row information) Yes - validated most recent copy scanned in chart (See row information)   Would patient like information on creating a medical advance directive?     No - Patient declined    Current Medications (verified) Outpatient Encounter Medications as of 10/03/2021  Medication Sig   aspirin 81 MG tablet Take 81 mg by mouth daily.   Calcium-Vitamin D-Vitamin K 650-12.5-40 MG-MCG-MCG CHEW Chew 1 tablet by mouth daily.   fluticasone (FLONASE) 50 MCG/ACT nasal spray USE 1 SPRAY INTO EACH NOSTRIL TWICE DAILY    Multiple Vitamins-Minerals (MULTIVITAMIN GUMMIES WOMENS) CHEW Chew 2 tablets by mouth daily.   No facility-administered encounter medications on file as of 10/03/2021.    Allergies (verified) Sulfonamide derivatives   History: Past Medical History:  Diagnosis Date   Allergic rhinitis    Past Surgical History:  Procedure Laterality Date   BREAST LUMPECTOMY Left    fibroadenoma L breast   EYE SURGERY Bilateral 09/02/2019, 08/18/2019   cataract sx Dr.Steven GEulas Post     MYRINGOTOMY     Left   TOTAL ABDOMINAL HYSTERECTOMY W/ BILATERAL SALPINGOOPHORECTOMY     Family History  Problem Relation Age of Onset   Atrial fibrillation Mother    Breast cancer Mother 75  Thyroid disease Mother    Cancer Mother 645      prostate   Heart disease Mother        a fib   Hypertension Mother    Prostate cancer Father    Hypertension Father    Cancer Father 637      prostate   Thyroid disease Brother    Arthritis Brother    Thyroid disease Sister    Osteoarthritis Other 864  Colon cancer Neg Hx    Social History   Socioeconomic History   Marital status: Married    Spouse name: Not on file   Number of children: Not on file   Years of education: Not  on file   Highest education level: Not on file  Occupational History   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   Occupation: retired    Comment: 2018  Tobacco Use   Smoking status: Never   Smokeless tobacco: Never  Substance and Sexual Activity   Alcohol use: Yes    Alcohol/week: 2.0 standard drinks of alcohol    Types: 2 Glasses of wine per week   Drug use: No   Sexual activity: Yes    Partners: Male  Other Topics Concern   Not on file  Social History Narrative   Exercise-- walk almost everyday   Social Determinants of Health   Financial Resource Strain: Low Risk  (09/27/2020)   Overall Financial Resource Strain (CARDIA)    Difficulty of Paying Living Expenses: Not hard at all   Food Insecurity: No Food Insecurity (09/27/2020)   Hunger Vital Sign    Worried About Running Out of Food in the Last Year: Never true    Two Strike in the Last Year: Never true  Transportation Needs: No Transportation Needs (09/27/2020)   PRAPARE - Hydrologist (Medical): No    Lack of Transportation (Non-Medical): No  Physical Activity: Sufficiently Active (09/27/2020)   Exercise Vital Sign    Days of Exercise per Week: 4 days    Minutes of Exercise per Session: 40 min  Stress: No Stress Concern Present (09/27/2020)   [place]    Feeling of Stress : Not at all  Social Connections: [place] (09/27/2020)   Social Connection and Isolation Panel [NHANES]    Frequency of Communication with Friends and Family: More than three times a week    Frequency of Social Gatherings with Friends and Family: More than three times a week    Attends Religious Services: More than 4 times per year    Active Member of [employer] or Organizations: Yes    Attends Music therapist: More than 4 times per year    Marital Status: Married    Tobacco Counseling Counseling given: Not Answered   Clinical Intake:  Pre-visit preparation completed: Yes  Pain : No/denies pain     BMI - recorded: 27.46 Nutritional Status: BMI 25 -29 Overweight Nutritional Risks: None Diabetes: No  How often do you need to have someone help you when you read instructions, pamphlets, or other written materials from your doctor or pharmacy?: 1 - Never  Diabetic?no  Interpreter Needed?: No  Information entered by :: Dajha Urquilla   Activities of Daily Living    10/03/2021    2:35 PM  In your present state of health, do you have any difficulty performing the following activities:  Hearing? 0  Vision? 0  Difficulty concentrating or making decisions? 0  Walking or climbing stairs? 0  Dressing or bathing? 0   Doing errands, shopping? 0  Preparing Food and eating ? N  Using the Toilet? N  In the past six months, have you accidently leaked urine? Y  Do you have problems with loss of bowel control? N  Managing your Medications? N  Managing your Finances? N  Housekeeping or managing your Housekeeping? N    Patient Care Team: Carollee Herter, Alferd Apa, DO as PCP - General Regal, Tamala Fothergill, DPM as Consulting Physician (Podiatry) Ulla Gallo, MD as Consulting Physician (Dermatology) Lonia Skinner, MD as Consulting  Physician (Ophthalmology)  Indicate any recent Medical Services you may have received from other than [facility] providers in the past year (date may be approximate).     Assessment:   This is a routine wellness examination for [facility].  Hearing/Vision screen No results found.  Dietary issues and exercise activities discussed: Current Exercise Habits: Home exercise routine, Type of exercise: walking, Time (Minutes): 60, Frequency (Times/Week): 7, Weekly Exercise (Minutes/Week): 420, Intensity: Mild, Exercise limited by: None identified   Goals Addressed             This Visit's Progress    maintain healthy active lifestyle.   On track     Depression Screen    10/03/2021    2:33 PM 09/27/2020   10:27 AM 09/19/2019   10:15 AM 03/06/2019    8:46 AM 02/26/2018    5:33 PM 02/01/2017    3:20 PM 12/05/2012   10:45 AM  PHQ 2/9 Scores  PHQ - 2 Score 0 0 0 0 0 0 0  PHQ- 9 Score     0      Fall Risk    10/03/2021    2:32 PM 09/27/2020   10:26 AM 09/19/2019   10:15 AM 03/06/2019    9:06 AM 02/01/2017    3:20 PM  [place] in the past year? 0 0 0 0 No  Number falls in past yr: 0 0 0 0   Injury with Fall? 0 0 0 0   Risk for fall due to : No Fall Risks      Follow up Falls evaluation completed Falls prevention discussed Education provided;Falls prevention discussed Falls prevention discussed     FALL RISK PREVENTION PERTAINING TO THE HOME:  Any stairs in or around  the home? No  If so, are there any without handrails?  N/a Home free of loose throw rugs in walkways, pet beds, electrical cords, etc? Yes  Adequate lighting in your home to reduce risk of falls? Yes   ASSISTIVE DEVICES UTILIZED TO PREVENT FALLS:  Life alert? No  Use of a cane, walker or w/c? No  Grab bars in the bathroom? Yes  Shower chair or bench in shower? No  Elevated toilet seat or a handicapped toilet? No   TIMED UP AND GO:  Was the test performed? Yes .  Length of time to ambulate 10 feet: 9 sec.   Gait slow and steady with assistive device  Cognitive Function:    02/01/2017    3:51 PM  MMSE - Mini Mental State Exam  Orientation to time 5  Orientation to Place 5  Registration 3  Attention/ Calculation 5  Recall 3  Language- name 2 objects 2  Language- repeat 1  Language- follow 3 step command 3  Language- read & follow direction 1  Write a sentence 1  Copy design 1  Total score 30        10/03/2021    2:36 PM  6CIT Screen  What Year? 0 points  What month? 0 points  What time? 0 points  Count back from 20 0 points  Months in reverse 0 points  Repeat phrase 0 points  Total Score 0 points    Immunizations Immunization History  Administered Date(s) Administered   Influenza Whole 01/26/2009, 02/19/2012   Influenza, High Dose Seasonal PF 01/15/2019   Influenza,inj,Quad PF,6+ Mos 02/18/2013, 01/30/2014, 02/01/2017   Influenza-Unspecified 01/23/2016, 02/12/2018, 01/26/2020, 01/17/2021   Moderna Covid-19 Vaccine Bivalent Booster 67yr & up 01/17/2021  PFIZER(Purple Top)SARS-COV-2 Vaccination 06/07/2019, 06/30/2019, 01/29/2020   Pneumococcal Conjugate-13 02/01/2017   Pneumococcal Polysaccharide-23 02/26/2018   Td 11/22/2007   Tdap 11/22/2007   Zoster Recombinat (Shingrix) 12/21/2020, 03/03/2021   Zoster, Live 03/23/2014    TDAP status: Up to date  Flu Vaccine status: Up to date  Pneumococcal vaccine status: Up to date  Covid-19 vaccine status:  Completed vaccines  Qualifies for Shingles Vaccine? Yes   Zostavax completed No   Shingrix completed Yes  Screening Tests Health Maintenance  Topic Date Due   COVID-19 Vaccine (5 - Pfizer series) 05/19/2021   INFLUENZA VACCINE  11/22/2021   DEXA SCAN  01/04/2022   MAMMOGRAM  01/07/2022   COLONOSCOPY (Pts 45-53yr Insurance coverage will need to be confirmed)  11/01/2022   TETANUS/TDAP  03/06/2028   Pneumonia Vaccine 70 Years old  Completed   Hepatitis C Screening  Completed   Zoster Vaccines- Shingrix  Completed   HPV VACCINES  Aged Out    Health Maintenance  Health Maintenance Due  Topic Date Due   COVID-19 Vaccine (5 - Pfizer series) 05/19/2021    Colorectal cancer screening: Type of screening: Colonoscopy. Completed 10/31/12. Repeat every 10 years  Mammogram status: Completed 01/07/21. Repeat every year  Bone Density status: Completed 01/05/20. Results reflect: Bone density results: OSTEOPENIA. Repeat every 2 years.  Lung Cancer Screening: (Low Dose CT Chest recommended if Age 70-80years, 30 pack-year currently smoking OR have quit w/in 15years.) does not qualify.   Lung Cancer Screening Referral: N/A  Additional Screening:  Hepatitis C Screening: does qualify; Completed 12/13/2015  Vision Screening: Recommended annual ophthalmology exams for early detection of glaucoma and other disorders of the eye. Is the patient up to date with their annual eye exam?  Yes  Who is the provider or what is the name of the office in which the patient attends annual eye exams? [facility] If pt is not established with a provider, would they like to be referred to a provider to establish care? No .   Dental Screening: Recommended annual dental exams for proper oral hygiene  Community Resource Referral / Chronic Care Management: CRR required this visit?  No   CCM required this visit?  No      Plan:     I have personally reviewed and noted the following in the patient's  chart:   Medical and social history Use of alcohol, tobacco or illicit drugs  Current medications and supplements including opioid prescriptions.  Functional ability and status Nutritional status Physical activity Advanced directives List of other physicians Hospitalizations, surgeries, and ER visits in previous 12 months Vitals Screenings to include cognitive, depression, and falls Referrals and appointments  In addition, I have reviewed and discussed with patient certain preventive protocols, quality metrics, and best practice recommendations. A written personalized care plan for preventive services as well as general preventive health recommendations were provided to patient.     SDuard BradyChism, CNorth [place]  10/11/2021   Nurse Notes: none

## 2021-11-20 IMAGING — MG MM DIGITAL SCREENING BILAT W/ TOMO AND CAD
8 series · 8 of 24 positions shown · non-contrast
Comparison: Previous exam(s).

CLINICAL DATA: Screening.

EXAM:
DIGITAL SCREENING BILATERAL MAMMOGRAM WITH TOMOSYNTHESIS AND CAD
TECHNIQUE: Bilateral screening digital craniocaudal and mediolateral oblique
mammograms were obtained. Bilateral screening digital breast
tomosynthesis was performed. The images were evaluated with
computer-aided detection.

[R MLO synth-2D]
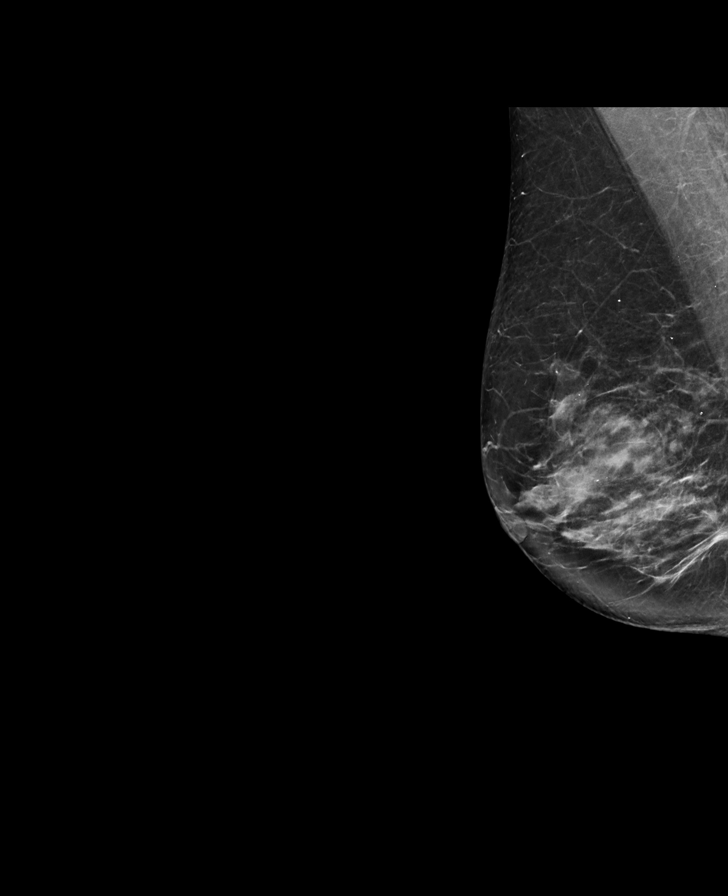

[L CC synth-2D]
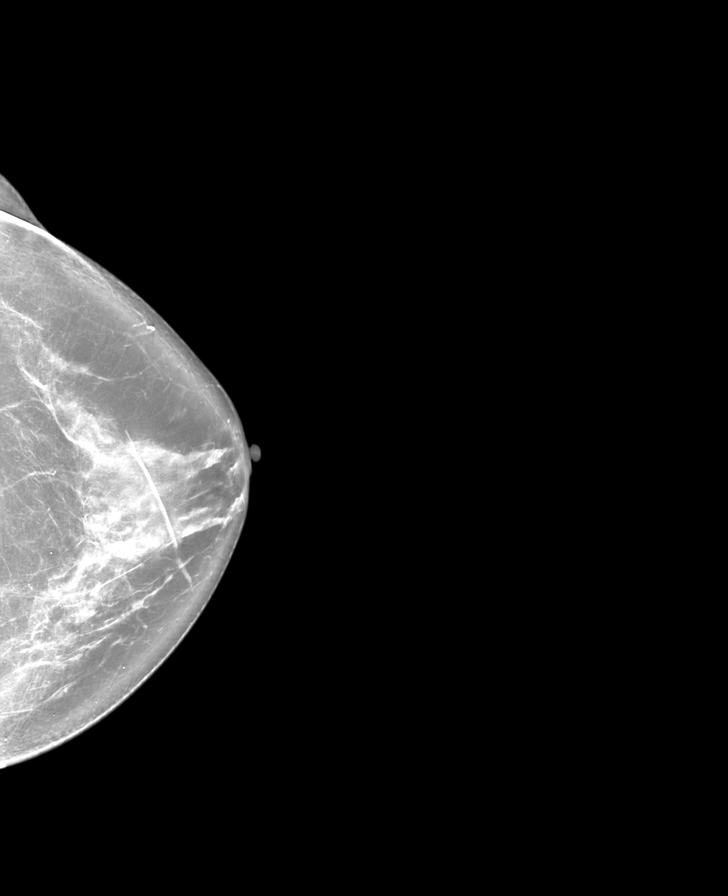

[R CC synth-2D]
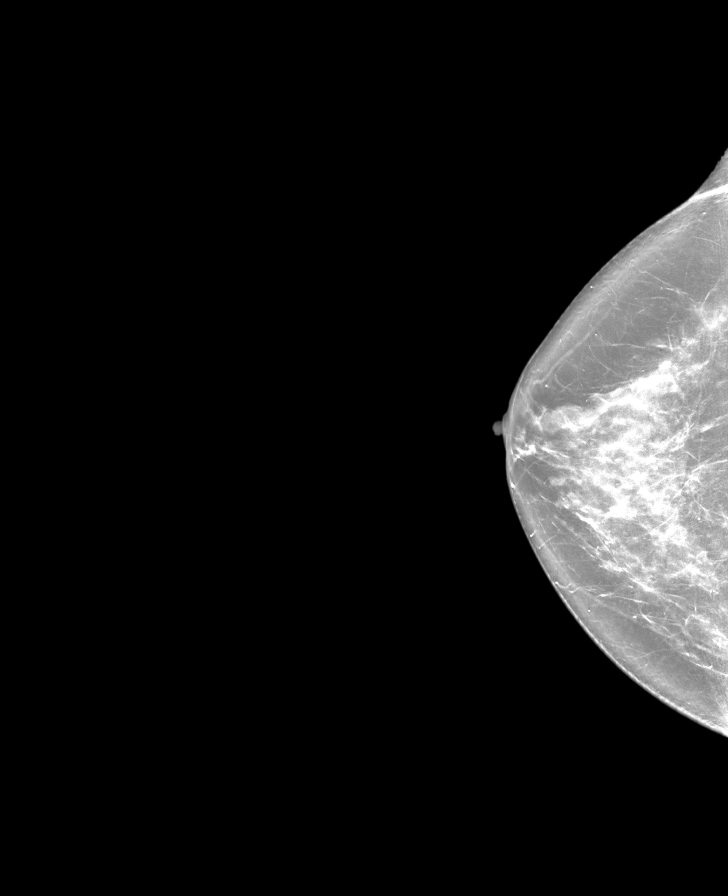

[L MLO synth-2D]
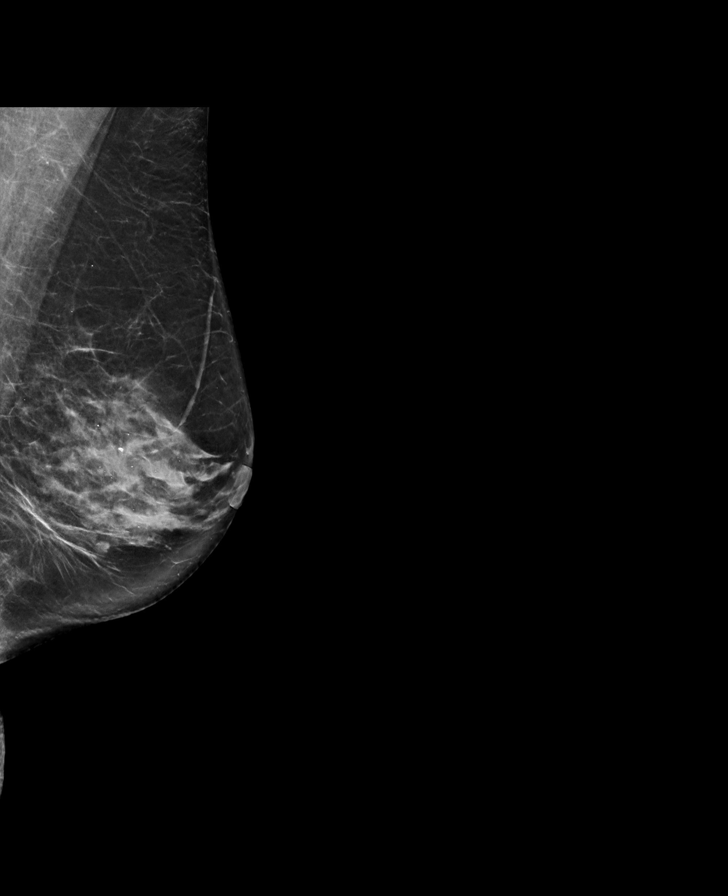

[L MLO tomo · tomo slice 41/81.0]
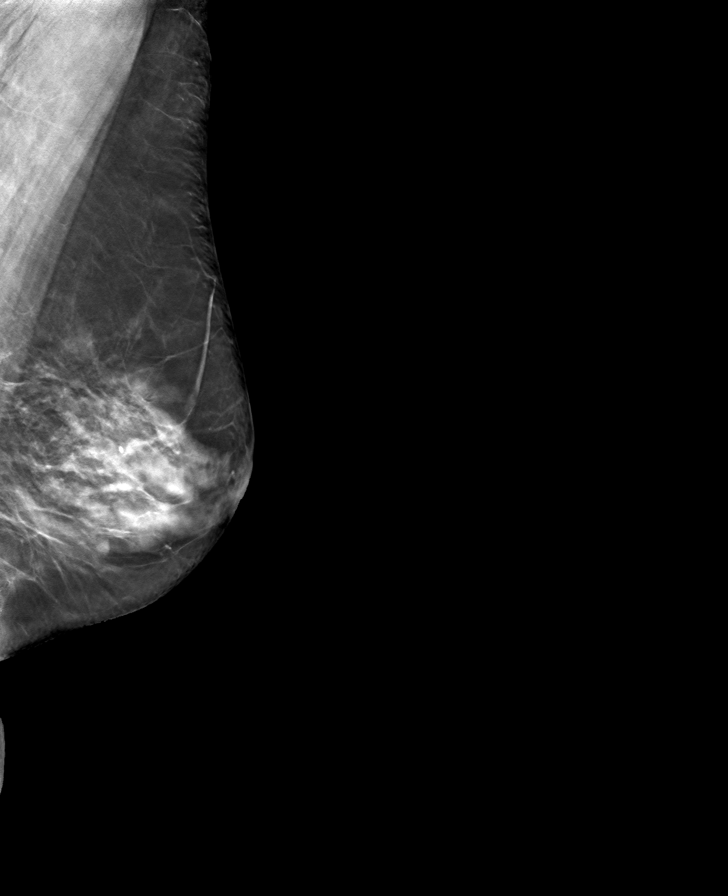

[R CC tomo · tomo slice 39/76.0]
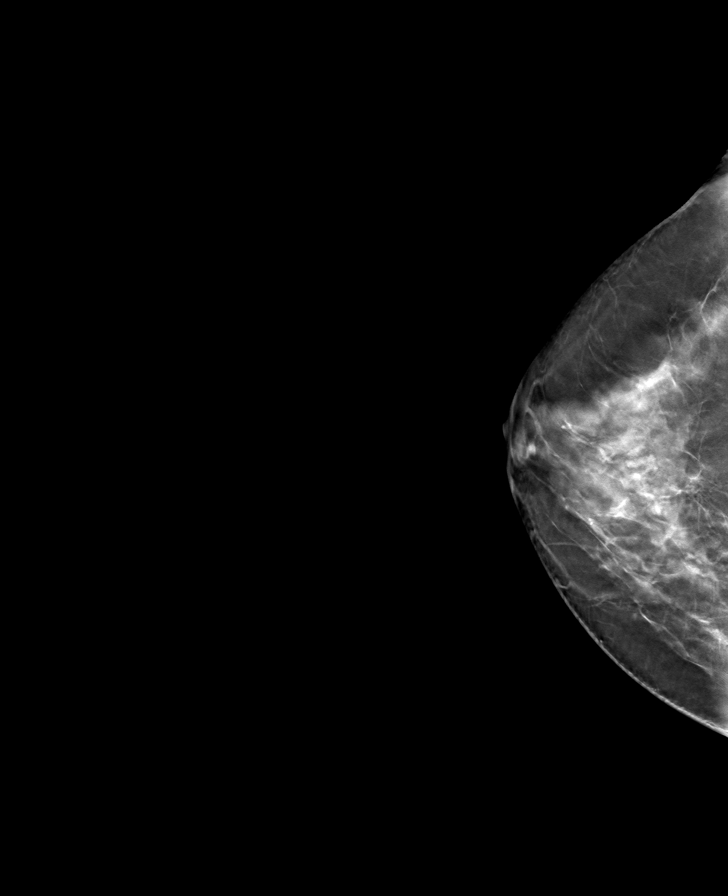

[R MLO tomo · tomo slice 39/77.0]
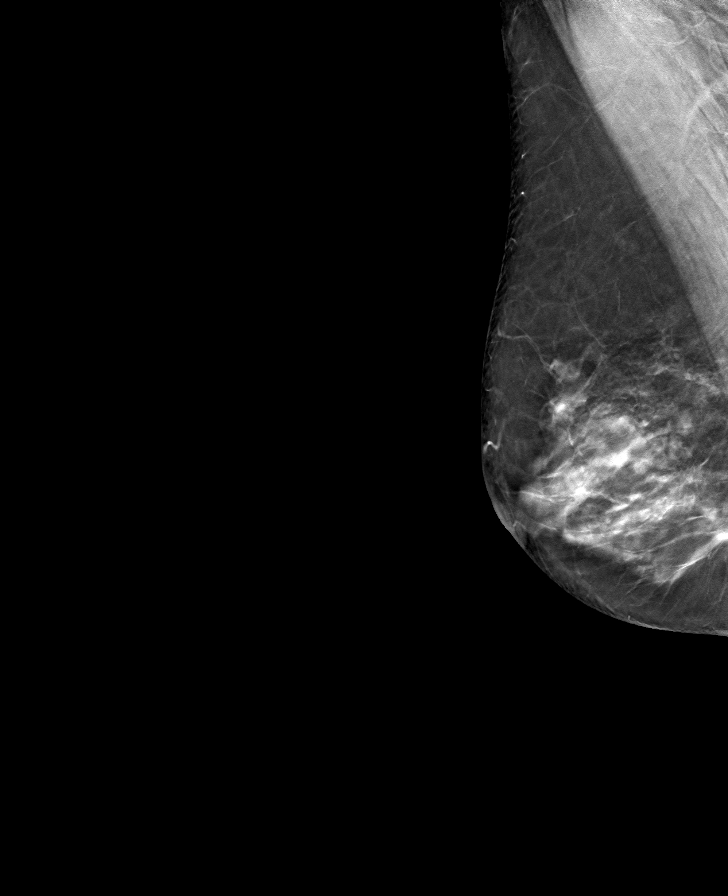

[L CC tomo · tomo slice 43/86.0]
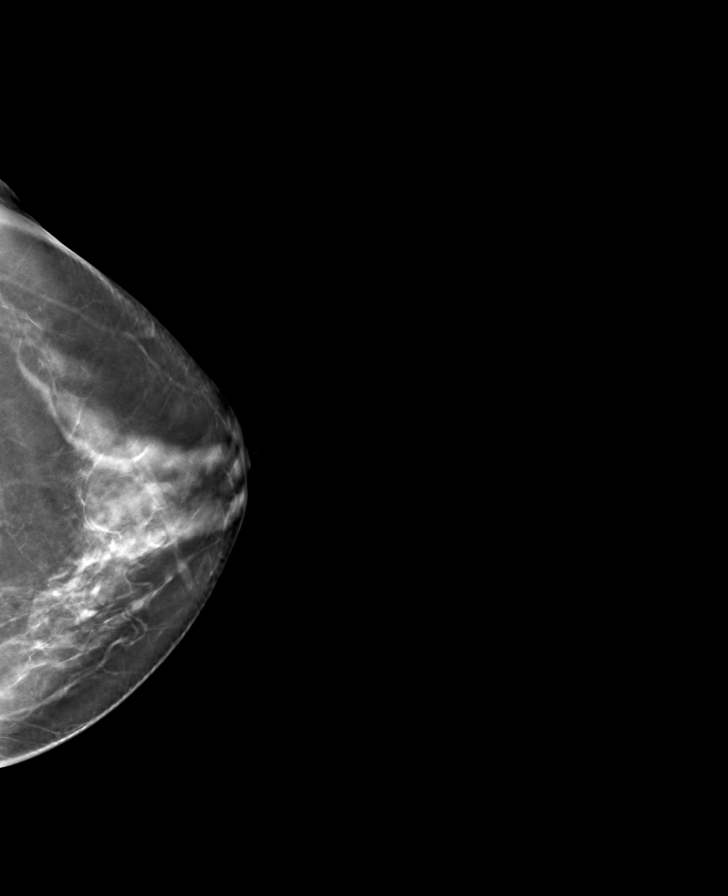

[8 of 24 positions shown; findings below may reference images not displayed]

ACR Breast Density Category c: The breast tissue is heterogeneously
dense, which may obscure small masses.
FINDINGS: There are no findings suspicious for malignancy.
IMPRESSION: No mammographic evidence of malignancy. A result letter of this
screening mammogram will be mailed directly to the patient.

RECOMMENDATION:
Screening mammogram in one year. (Code:Q3-W-BC3)

BI-RADS CATEGORY  1: Negative.

## 2021-12-09 ENCOUNTER — Encounter: Payer: Self-pay | Admitting: Family Medicine

## 2021-12-09 ENCOUNTER — Other Ambulatory Visit: Payer: Self-pay | Admitting: Family Medicine

## 2021-12-09 ENCOUNTER — Other Ambulatory Visit: Payer: Self-pay | Admitting: *Deleted

## 2021-12-09 DIAGNOSIS — Z1231 Encounter for screening mammogram for malignant neoplasm of breast: Secondary | ICD-10-CM

## 2021-12-09 DIAGNOSIS — Z780 Asymptomatic menopausal state: Secondary | ICD-10-CM

## 2022-01-10 ENCOUNTER — Ambulatory Visit
Admission: RE | Admit: 2022-01-10 | Discharge: 2022-01-10 | Disposition: A | Discharge status: Home / Self Care | Payer: PPO | Source: Ambulatory Visit | Attending: Family Medicine | Admitting: Family Medicine

## 2022-01-10 DIAGNOSIS — Z1231 Encounter for screening mammogram for malignant neoplasm of breast: Secondary | ICD-10-CM | POA: Diagnosis not present

## 2022-03-20 ENCOUNTER — Encounter: Payer: PPO | Admitting: Family Medicine

## 2022-04-10 ENCOUNTER — Ambulatory Visit (HOSPITAL_BASED_OUTPATIENT_CLINIC_OR_DEPARTMENT_OTHER)
Admission: RE | Admit: 2022-04-10 | Discharge: 2022-04-10 | Disposition: A | Discharge status: Home / Self Care | Payer: PPO | Source: Ambulatory Visit | Attending: Family Medicine | Admitting: Family Medicine

## 2022-04-10 ENCOUNTER — Ambulatory Visit (INDEPENDENT_AMBULATORY_CARE_PROVIDER_SITE_OTHER): Payer: PPO | Admitting: Family Medicine

## 2022-04-10 ENCOUNTER — Encounter: Payer: Self-pay | Admitting: Family Medicine

## 2022-04-10 VITALS — BP 142/70 | HR 66 | Temp 97.5°F | Resp 18 | Ht 65.0 in | Wt 160.8 lb

## 2022-04-10 DIAGNOSIS — M25552 Pain in left hip: Secondary | ICD-10-CM | POA: Diagnosis not present

## 2022-04-10 DIAGNOSIS — Z780 Asymptomatic menopausal state: Secondary | ICD-10-CM | POA: Diagnosis not present

## 2022-04-10 DIAGNOSIS — M545 Low back pain, unspecified: Secondary | ICD-10-CM | POA: Insufficient documentation

## 2022-04-10 DIAGNOSIS — R1013 Epigastric pain: Secondary | ICD-10-CM

## 2022-04-10 DIAGNOSIS — M79605 Pain in left leg: Secondary | ICD-10-CM

## 2022-04-10 DIAGNOSIS — Z0000 Encounter for general adult medical examination without abnormal findings: Secondary | ICD-10-CM

## 2022-04-10 DIAGNOSIS — Z136 Encounter for screening for cardiovascular disorders: Secondary | ICD-10-CM | POA: Diagnosis not present

## 2022-04-10 DIAGNOSIS — Z0001 Encounter for general adult medical examination with abnormal findings: Secondary | ICD-10-CM

## 2022-04-10 LAB — COMPREHENSIVE METABOLIC PANEL
ALT: 14 U/L (ref 0–35)
AST: 16 U/L (ref 0–37)
Albumin: 4.3 g/dL (ref 3.5–5.2)
Alkaline Phosphatase: 94 U/L (ref 39–117)
BUN: 12 mg/dL (ref 6–23)
CO2: 28 mEq/L (ref 19–32)
Calcium: 9.4 mg/dL (ref 8.4–10.5)
Chloride: 100 mEq/L (ref 96–112)
Creatinine, Ser: 0.6 mg/dL (ref 0.40–1.20)
GFR: 90.89 mL/min (ref >60.00)
Glucose, Bld: 101 mg/dL — ABNORMAL HIGH (ref 70–99)
Potassium: 4.5 mEq/L (ref 3.5–5.1)
Sodium: 136 mEq/L (ref 135–145)
Total Bilirubin: 0.6 mg/dL (ref 0.2–1.2)
Total Protein: 6.9 g/dL (ref 6.0–8.3)

## 2022-04-10 LAB — CBC WITH DIFFERENTIAL/PLATELET
Basophils Absolute: 0.1 10*3/uL (ref 0.0–0.1)
Basophils Relative: 1.1 % (ref 0.0–3.0)
Eosinophils Absolute: 0.1 10*3/uL (ref 0.0–0.7)
Eosinophils Relative: 1.2 % (ref 0.0–5.0)
HCT: 40.9 % (ref 36.0–46.0)
Hemoglobin: 13.8 g/dL (ref 12.0–15.0)
Lymphocytes Relative: 22.2 % (ref 12.0–46.0)
Lymphs Abs: 1.2 10*3/uL (ref 0.7–4.0)
MCHC: 33.8 g/dL (ref 30.0–36.0)
MCV: 87.3 fl (ref 78.0–100.0)
Monocytes Absolute: 0.5 10*3/uL (ref 0.1–1.0)
Monocytes Relative: 8.8 % (ref 3.0–12.0)
Neutro Abs: 3.6 10*3/uL (ref 1.4–7.7)
Neutrophils Relative %: 66.7 % (ref 43.0–77.0)
Platelets: 287 10*3/uL (ref 150.0–400.0)
RBC: 4.69 Mil/uL (ref 3.87–5.11)
RDW: 14 % (ref 11.5–15.5)
WBC: 5.5 10*3/uL (ref 4.0–10.5)

## 2022-04-10 LAB — LIPID PANEL
Cholesterol: 179 mg/dL (ref 0–200)
HDL: 86.4 mg/dL (ref >39.00)
LDL Cholesterol: 83 mg/dL (ref 0–99)
NonHDL: 93.03
Total CHOL/HDL Ratio: 2
Triglycerides: 52 mg/dL (ref 0.0–149.0)
VLDL: 10.4 mg/dL (ref 0.0–40.0)

## 2022-04-10 LAB — TSH: TSH: 1.39 u[IU]/mL (ref 0.35–5.50)

## 2022-04-10 MED ORDER — OMEPRAZOLE 20 MG PO CPDR: 20.0000 mg | DELAYED_RELEASE_CAPSULE | Freq: Every day | ORAL | 3 refills | Status: DC

## 2022-04-10 NOTE — Progress Notes (Signed)
Subjective:   By signing my name below, I, Carylon Perches, attest that this documentation has been prepared under the direction and in the presence of Ann Held DO 04/10/2022    Patient ID: Laura Price, female    DOB: [birth year], 71 y.o.   MRN: [id number]  Chief Complaint  Patient presents with   Annual Exam    Pt states fasting     HPI Patient is in today for a comprehensive physical exam.  She complains of left hip pain. She was doing water aerobics on 02/22/2022 and noticed pain on 02/23/2022. She states that she has been stiff from sitting and has previously been taking OTC inflammatory medication (Ibuprofen and Tylenol) to alleviate symptoms. She denies that her legs are giving out on her. She thinks that she had back pain from the way that she is walking and notes of some pain radiating from her left thigh. She has an appointment scheduled with her Orthopedic in 04/2022.   She has been regularly following up with her dermatologist, Dr.Jewell.   She reports that her hands become stiff in the mornings. She has not taken anything for her medications. She states that every once in awhile, she gets a sharp pain in her finger joints.  She reports that her acid reflux is worsening. She has been taking Tums more frequently which alleviates her symptoms. She believes that tomatoes trigger her symptoms. She notices that her symptoms worsen when she lays on her side. She states that she occasionally has been woken up from acid reflux.   She denies having any fever, new muscle pain, joint pain , new moles, congestion, sinus pain, sore throat, chest pain, palpations, cough, SOB ,wheezing,n/v/d constipation, blood in stool, dysuria, frequency, hematuria, at this time  She denies of any changes to her family medical history. She reports no recent surgeries.  Colonoscopy was last completed on 10/31/2012 Dexa was last completed on 01/05/2020 Mammogram was last completed on  01/10/2022 She is UTD on her Influenza, Shingles and Pneumonia vaccines. She reports that she had an updated tetanus vaccine in 2019 at [facility]. She has received an updated Covid vaccine on 02/27/2022.  She is still walking some but not as much as she used to due to her myalgia. She is also doing water aerobics.  She is UTD on dental exams.  She is UTD on vision exams.    Past Medical History:  Diagnosis Date   Allergic rhinitis     Past Surgical History:  Procedure Laterality Date   BREAST LUMPECTOMY Left    fibroadenoma L breast   EYE SURGERY Bilateral 09/02/2019, 08/18/2019   cataract sx Dr.Steven Eulas Post      MYRINGOTOMY     Left   TOTAL ABDOMINAL HYSTERECTOMY W/ BILATERAL SALPINGOOPHORECTOMY      Family History  Problem Relation Age of Onset   Atrial fibrillation Mother    Breast cancer Mother 26   Thyroid disease Mother    Cancer Mother 37       prostate   Heart disease Mother        a fib   Hypertension Mother    Prostate cancer Father    Hypertension Father    Cancer Father 64       prostate   Thyroid disease Brother    Arthritis Brother    Thyroid disease Sister    Osteoarthritis Other 44   Colon cancer Neg Hx     Social History  Socioeconomic History   Marital status: Married    Spouse name: Not on file   Number of children: Not on file   Years of education: Not on file   Highest education level: Not on file  Occupational History   Occupation: [place]    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   Occupation: retired    Comment: 2018  Tobacco Use   Smoking status: Never   Smokeless tobacco: Never  Substance and Sexual Activity   Alcohol use: Yes    Alcohol/week: 2.0 standard drinks of alcohol    Types: 2 Glasses of wine per week   Drug use: No   Sexual activity: Yes    Partners: Male  Other Topics Concern   Not on file  Social History Narrative   Exercise-- walk almost everyday, water aerobics     Social Determinants of Health   Financial Resource Strain: Low Risk  (09/27/2020)   Overall Financial Resource Strain (CARDIA)    Difficulty of Paying Living Expenses: Not hard at all  Food Insecurity: No Food Insecurity (09/27/2020)   Hunger Vital Sign    Worried About Running Out of Food in the Last Year: Never true    [place] in the Last Year: Never true  Transportation Needs: No Transportation Needs (09/27/2020)   PRAPARE - Hydrologist (Medical): No    Lack of Transportation (Non-Medical): No  Physical Activity: Sufficiently Active (09/27/2020)   Exercise Vital Sign    Days of Exercise per Week: 4 days    Minutes of Exercise per Session: 40 min  Stress: No Stress Concern Present (09/27/2020)   [place]    Feeling of Stress : Not at all  Social Connections: [place] (09/27/2020)   Social Connection and Isolation Panel [NHANES]    Frequency of Communication with Friends and Family: More than three times a week    Frequency of Social Gatherings with Friends and Family: More than three times a week    Attends Religious Services: More than 4 times per year    Active Member of [employer] or Organizations: Yes    Attends Music therapist: More than 4 times per year    Marital Status: Married  Human resources officer Violence: Not At Risk (09/27/2020)   Humiliation, Afraid, Rape, and Kick questionnaire    Fear of Current or Ex-Partner: No    Emotionally Abused: No    Physically Abused: No    Sexually Abused: No    Outpatient Medications Prior to Visit  Medication Sig Dispense Refill   Calcium-Vitamin D-Vitamin K 650-12.5-40 MG-MCG-MCG CHEW Chew 1 tablet by mouth daily.     fluticasone (FLONASE) 50 MCG/ACT nasal spray USE 1 SPRAY INTO EACH NOSTRIL TWICE DAILY 16 g 5   Multiple Vitamins-Minerals (MULTIVITAMIN GUMMIES WOMENS) CHEW Chew 2 tablets by mouth daily.     aspirin 81  MG tablet Take 81 mg by mouth daily.     No facility-administered medications prior to visit.    Allergies  Allergen Reactions   Sulfonamide Derivatives Swelling and Rash    Review of Systems  Constitutional:  Negative for fever and malaise/fatigue.  HENT:  Negative for congestion.   Eyes:  Negative for blurred vision.  Respiratory:  Negative for cough and shortness of breath.   Cardiovascular:  Negative for chest pain, palpitations and leg swelling.  Gastrointestinal:  Negative for vomiting.  Musculoskeletal:  Positive for joint pain (Left Hip) and myalgias (Left Thigh). Negative for back pain.  Skin:  Negative for rash.  Neurological:  Negative for loss of consciousness and headaches.       Objective:    Physical Exam Constitutional:      General: She is not in acute distress.    Appearance: Normal appearance. She is not ill-appearing.  HENT:     Head: Normocephalic and atraumatic.     Right Ear: Tympanic membrane, ear canal and external ear normal.     Left Ear: Tympanic membrane, ear canal and external ear normal.     Mouth/Throat:     Pharynx: No oropharyngeal exudate.  Eyes:     Extraocular Movements: Extraocular movements intact.     Pupils: Pupils are equal, round, and reactive to light.  Neck:     Thyroid: No thyromegaly.  Cardiovascular:     Rate and Rhythm: Normal rate and regular rhythm.     Heart sounds: Normal heart sounds. No murmur heard.    No gallop.  Pulmonary:     Effort: Pulmonary effort is normal. No respiratory distress.     Breath sounds: Normal breath sounds. No wheezing or rales.  Abdominal:     General: Bowel sounds are normal. There is no distension.     Palpations: Abdomen is soft.     Tenderness: There is no abdominal tenderness. There is no guarding.  Lymphadenopathy:     Cervical: No cervical adenopathy.  Skin:    General: Skin is warm and dry.  Neurological:     Mental Status: She is alert and oriented to person, place, and  time.  Psychiatric:        Judgment: Judgment normal.     BP (!) 142/70 (BP Location: Left Arm, Patient Position: Sitting, Cuff Size: Normal)   Pulse 66   Temp (!) 97.5 F (36.4 C) (Oral)   Resp 18   Ht '5\' 5"'$  (1.651 m)   Wt 160 lb 12.8 oz (72.9 kg)   SpO2 97%   BMI 26.76 kg/m  Wt Readings from Last 3 Encounters:  04/10/22 160 lb 12.8 oz (72.9 kg)  10/03/21 165 lb (74.8 kg)  03/14/21 161 lb 6.4 oz (73.2 kg)    Diabetic Foot Exam - Simple   No data filed    Lab Results  Component Value Date   WBC 5.5 04/10/2022   HGB 13.8 04/10/2022   HCT 40.9 04/10/2022   PLT 287.0 04/10/2022   GLUCOSE 101 (H) 04/10/2022   CHOL 179 04/10/2022   TRIG 52.0 04/10/2022   HDL 86.40 04/10/2022   LDLCALC 83 04/10/2022   ALT 14 04/10/2022   AST 16 04/10/2022   NA 136 04/10/2022   K 4.5 04/10/2022   CL 100 04/10/2022   CREATININE 0.60 04/10/2022   BUN 12 04/10/2022   CO2 28 04/10/2022   TSH 1.39 04/10/2022   MICROALBUR 0.4 03/23/2014    Lab Results  Component Value Date   TSH 1.39 04/10/2022   Lab Results  Component Value Date   WBC 5.5 04/10/2022   HGB 13.8 04/10/2022   HCT 40.9 04/10/2022   MCV 87.3 04/10/2022   PLT 287.0 04/10/2022   Lab Results  Component Value Date   NA 136 04/10/2022   K 4.5 04/10/2022   CO2 28 04/10/2022   GLUCOSE 101 (H) 04/10/2022   BUN 12 04/10/2022   CREATININE 0.60 04/10/2022   BILITOT 0.6 04/10/2022   ALKPHOS 94 04/10/2022  AST 16 04/10/2022   ALT 14 04/10/2022   PROT 6.9 04/10/2022   ALBUMIN 4.3 04/10/2022   CALCIUM 9.4 04/10/2022   GFR 90.89 04/10/2022   Lab Results  Component Value Date   CHOL 179 04/10/2022   Lab Results  Component Value Date   HDL 86.40 04/10/2022   Lab Results  Component Value Date   LDLCALC 83 04/10/2022   Lab Results  Component Value Date   TRIG 52.0 04/10/2022   Lab Results  Component Value Date   CHOLHDL 2 04/10/2022   No results found for: "HGBA1C"     Assessment & Plan:   Problem  List Items Addressed This Visit       Unprioritized   Postmenopausal estrogen deficiency   Relevant Orders   CBC with Differential/Platelet (Completed)   Comprehensive metabolic panel (Completed)   Lipid panel (Completed)   TSH (Completed)   Low back pain radiating to left leg    Check xray  Tylenol as needed  Heat/ ice       Relevant Orders   DG Lumbar Spine Complete (Completed)   Left hip pain    Check xray Pt has app with ortho      Relevant Orders   DG Hip Unilat W OR W/O Pelvis 2-3 Views Left (Completed)   Ischemic heart disease screen - Primary   Relevant Orders   CBC with Differential/Platelet (Completed)   Comprehensive metabolic panel (Completed)   Lipid panel (Completed)   TSH (Completed)   Dyspepsia   Relevant Medications   omeprazole (PRILOSEC) 20 MG capsule   Meds ordered this encounter  Medications   omeprazole (PRILOSEC) 20 MG capsule    Sig: Take 1 capsule (20 mg total) by mouth daily.    Dispense:  30 capsule    Refill:  3    I, Ann Held, DO, personally preformed the services described in this documentation.  All medical record entries made by the scribe were at my direction and in my presence.  I have reviewed the chart and discharge instructions (if applicable) and agree that the record reflects my personal performance and is accurate and complete. 04/10/2022   I,Amber Collins,acting as a scribe for Ann Held, DO.,have documented all relevant documentation on the behalf of Ann Held, DO,as directed by  Ann Held, DO while in the presence of Ann Held, DO.    Ann Held, DO

## 2022-04-10 NOTE — Patient Instructions (Signed)
Preventive Care 65 Years and Older, Female Preventive care refers to lifestyle choices and visits with your health care provider that can promote health and wellness. Preventive care visits are also called wellness exams. What can I expect for my preventive care visit? Counseling Your health care provider may ask you questions about your: Medical history, including: Past medical problems. Family medical history. Pregnancy and menstrual history. History of falls. Current health, including: Memory and ability to understand (cognition). Emotional well-being. Home life and relationship well-being. Sexual activity and sexual health. Lifestyle, including: Alcohol, nicotine or tobacco, and drug use. Access to firearms. Diet, exercise, and sleep habits. Work and work environment. Sunscreen use. Safety issues such as seatbelt and bike helmet use. Physical exam Your health care provider will check your: Height and weight. These may be used to calculate your BMI (body mass index). BMI is a measurement that tells if you are at a healthy weight. Waist circumference. This measures the distance around your waistline. This measurement also tells if you are at a healthy weight and may help predict your risk of certain diseases, such as type 2 diabetes and high blood pressure. Heart rate and blood pressure. Body temperature. Skin for abnormal spots. What immunizations do I need?  Vaccines are usually given at various ages, according to a schedule. Your health care provider will recommend vaccines for you based on your age, medical history, and lifestyle or other factors, such as travel or where you work. What tests do I need? Screening Your health care provider may recommend screening tests for certain conditions. This may include: Lipid and cholesterol levels. Hepatitis C test. Hepatitis B test. HIV (human immunodeficiency virus) test. STI (sexually transmitted infection) testing, if you are at  risk. Lung cancer screening. Colorectal cancer screening. Diabetes screening. This is done by checking your blood sugar (glucose) after you have not eaten for a while (fasting). Mammogram. Talk with your health care provider about how often you should have regular mammograms. BRCA-related cancer screening. This may be done if you have a family history of breast, ovarian, tubal, or peritoneal cancers. Bone density scan. This is done to screen for osteoporosis. Talk with your health care provider about your test results, treatment options, and if necessary, the need for more tests. Follow these instructions at home: Eating and drinking  Eat a diet that includes fresh fruits and vegetables, whole grains, lean protein, and low-fat dairy products. Limit your intake of foods with high amounts of sugar, saturated fats, and salt. Take vitamin and mineral supplements as recommended by your health care provider. Do not drink alcohol if your health care provider tells you not to drink. If you drink alcohol: Limit how much you have to 0-1 drink a day. Know how much alcohol is in your drink. In the [place] one drink equals one 12 oz bottle of beer (355 mL), one 5 oz glass of wine (148 mL), or one 1 oz glass of hard liquor (44 mL). Lifestyle Brush your teeth every morning and night with fluoride toothpaste. Floss one time each day. Exercise for at least 30 minutes 5 or more days each week. Do not use any products that contain nicotine or tobacco. These products include cigarettes, chewing tobacco, and vaping devices, such as e-cigarettes. If you need help quitting, ask your health care provider. Do not use drugs. If you are sexually active, practice safe sex. Use a condom or other form of protection in order to prevent STIs. Take aspirin only as told by   your health care provider. Make sure that you understand how much to take and what form to take. Work with your health care provider to find out whether it  is safe and beneficial for you to take aspirin daily. Ask your health care provider if you need to take a cholesterol-lowering medicine (statin). Find healthy ways to manage stress, such as: Meditation, yoga, or listening to music. Journaling. Talking to a trusted person. Spending time with friends and family. Minimize exposure to UV radiation to reduce your risk of skin cancer. Safety Always wear your seat belt while driving or riding in a vehicle. Do not drive: If you have been drinking alcohol. Do not ride with someone who has been drinking. When you are tired or distracted. While texting. If you have been using any mind-altering substances or drugs. Wear a helmet and other protective equipment during sports activities. If you have firearms in your house, make sure you follow all gun safety procedures. What's next? Visit your health care provider once a year for an annual wellness visit. Ask your health care provider how often you should have your eyes and teeth checked. Stay up to date on all vaccines. This information is not intended to replace advice given to you by your health care provider. Make sure you discuss any questions you have with your health care provider. Document Revised: 10/06/2020 Document Reviewed: 10/06/2020 Elsevier Patient Education  2023 Elsevier Inc.  

## 2022-04-10 NOTE — Assessment & Plan Note (Signed)
Check xray Pt has app with ortho

## 2022-04-10 NOTE — Assessment & Plan Note (Signed)
Check xray  Tylenol as needed  Heat/ ice

## 2022-05-11 DIAGNOSIS — M7062 Trochanteric bursitis, left hip: Secondary | ICD-10-CM | POA: Diagnosis not present

## 2022-06-09 ENCOUNTER — Encounter: Payer: Self-pay | Admitting: Family Medicine

## 2022-06-12 ENCOUNTER — Ambulatory Visit
Admission: RE | Admit: 2022-06-12 | Discharge: 2022-06-12 | Disposition: A | Discharge status: Home / Self Care | Payer: PPO | Source: Ambulatory Visit | Attending: Family Medicine | Admitting: Family Medicine

## 2022-06-12 DIAGNOSIS — Z780 Asymptomatic menopausal state: Secondary | ICD-10-CM | POA: Diagnosis not present

## 2022-06-12 DIAGNOSIS — M85852 Other specified disorders of bone density and structure, left thigh: Secondary | ICD-10-CM | POA: Diagnosis not present

## 2022-08-03 DIAGNOSIS — Z961 Presence of intraocular lens: Secondary | ICD-10-CM | POA: Diagnosis not present

## 2022-08-03 DIAGNOSIS — H26493 Other secondary cataract, bilateral: Secondary | ICD-10-CM | POA: Diagnosis not present

## 2022-08-03 DIAGNOSIS — H43812 Vitreous degeneration, left eye: Secondary | ICD-10-CM | POA: Diagnosis not present

## 2022-08-03 DIAGNOSIS — H35371 Puckering of macula, right eye: Secondary | ICD-10-CM | POA: Diagnosis not present

## 2022-10-06 ENCOUNTER — Ambulatory Visit (INDEPENDENT_AMBULATORY_CARE_PROVIDER_SITE_OTHER): Payer: PPO | Admitting: *Deleted

## 2022-10-06 VITALS — BP 154/66 | HR 63 | Ht 65.0 in | Wt 160.4 lb

## 2022-10-06 DIAGNOSIS — Z1211 Encounter for screening for malignant neoplasm of colon: Secondary | ICD-10-CM

## 2022-10-06 DIAGNOSIS — Z0000 Encounter for general adult medical examination without abnormal findings: Secondary | ICD-10-CM

## 2022-10-06 NOTE — Progress Notes (Signed)
Subjective:   Laura Price is a 72 y.o. female who presents for Medicare Annual (Subsequent) preventive examination.  Review of Systems     Cardiac Risk Factors include: advanced age (>51men, >22 women)     Objective:    Today's Vitals   10/06/22 1104 10/06/22 1113  BP: (!) 147/70 (!) 154/66  Pulse: 66 63  Weight: 160 lb 6.4 oz (72.8 kg)   Height: 5\' 5"  (1.651 m)    Body mass index is 26.69 kg/m.     10/06/2022   11:01 AM 10/03/2021    2:31 PM 09/27/2020   10:24 AM 09/19/2019   10:13 AM 03/06/2018    8:34 AM 02/01/2017    3:55 PM  Advanced Directives  Does Patient Have a Medical Advance Directive? Yes Yes Yes Yes Yes No  Type of Estate agent of [place];Living will Healthcare Power of [place];Out of facility DNR (pink MOST or yellow form);Living will Healthcare Power of [place];Living will Healthcare Power of [place];Living will Healthcare Power of [place];Living will   Does patient want to make changes to medical advance directive? No - Patient declined No - Patient declined  No - Patient declined No - Patient declined   Copy of Healthcare Power of Attorney in Chart?  Yes - validated most recent copy scanned in chart (See row information) Yes - validated most recent copy scanned in chart (See row information) Yes - validated most recent copy scanned in chart (See row information) Yes - validated most recent copy scanned in chart (See row information)   Would patient like information on creating a medical advance directive?      No - Patient declined    Current Medications (verified) Outpatient Encounter Medications as of 10/06/2022  Medication Sig   Calcium-Vitamin D-Vitamin K 650-12.5-40 MG-MCG-MCG CHEW Chew 1 tablet by mouth daily.   fluticasone (FLONASE) 50 MCG/ACT nasal spray USE 1 SPRAY INTO EACH NOSTRIL TWICE DAILY   Multiple Vitamins-Minerals (MULTIVITAMIN GUMMIES WOMENS) CHEW Chew 2 tablets by mouth daily.   [DISCONTINUED]  omeprazole (PRILOSEC) 20 MG capsule Take 1 capsule (20 mg total) by mouth daily.   No facility-administered encounter medications on file as of 10/06/2022.    Allergies (verified) Sulfonamide derivatives   History: Past Medical History:  Diagnosis Date   Allergic rhinitis    Allergy    Sulfa   Anemia 2005   Resolved following hysterectomy   Arthritis 2009   Neck, right big toe joint, fingers, hips   Cataract 2018   surgery 08/19/19 and 09/02/19   Heart murmur Birth   Resolved at 3 years   Past Surgical History:  Procedure Laterality Date   ABDOMINAL HYSTERECTOMY  2005   secondary to fibroid tumors and  significant bleeding/anemia   BREAST LUMPECTOMY Left    fibroadenoma L breast   EYE SURGERY Bilateral 09/02/2019, 08/18/2019   cataract sx Dr.Steven Sherrine Maples      MYRINGOTOMY     Left   TOTAL ABDOMINAL HYSTERECTOMY W/ BILATERAL SALPINGOOPHORECTOMY     Family History  Problem Relation Age of Onset   Atrial fibrillation Mother    Breast cancer Mother 21   Thyroid disease Mother    Cancer Mother 81       prostate   Heart disease Mother        a fib   Hypertension Mother    Varicose Veins Mother    Prostate cancer Father    Hypertension Father    Cancer Father 22  prostate   Thyroid disease Brother    Arthritis Brother    Thyroid disease Sister    Cancer Sister    Osteoarthritis Other 78   Colon cancer Neg Hx    Social History   Socioeconomic History   Marital status: Married    Spouse name: Not on file   Number of children: Not on file   Years of education: Not on file   Highest education level: Not on file  Occupational History   Occupation: [employer]    Employer: [employer]    Comment: PT   Occupation: retired    Comment: 2018  Tobacco Use   Smoking status: Never   Smokeless tobacco: Never  Substance and Sexual Activity   Alcohol use: Yes    Alcohol/week: 3.0 standard drinks of alcohol    Types: 3 Glasses of wine per  week   Drug use: No   Sexual activity: Not Currently    Partners: Male    Birth control/protection: Post-menopausal  Other Topics Concern   Not on file  Social History Narrative   Exercise-- walk almost everyday, water aerobics    Social Determinants of Health   Financial Resource Strain: Low Risk  (09/27/2020)   Overall Financial Resource Strain (CARDIA)    Difficulty of Paying Living Expenses: Not hard at all  Food Insecurity: No Food Insecurity (10/06/2022)   Hunger Vital Sign    Worried About Running Out of Food in the Last Year: Never true    Ran Out of Food in the Last Year: Never true  Transportation Needs: No Transportation Needs (10/06/2022)   PRAPARE - Administrator, Civil Service (Medical): No    Lack of Transportation (Non-Medical): No  Physical Activity: Sufficiently Active (09/27/2020)   Exercise Vital Sign    Days of Exercise per Week: 4 days    Minutes of Exercise per Session: 40 min  Stress: No Stress Concern Present (09/27/2020)   [employer] of Occupational Health - Occupational Stress Questionnaire    Feeling of Stress : Not at all  Social Connections: Socially Integrated (09/27/2020)   Social Connection and Isolation Panel [NHANES]    Frequency of Communication with Friends and Family: More than three times a week    Frequency of Social Gatherings with Friends and Family: More than three times a week    Attends Religious Services: More than 4 times per year    Active Member of [employer] or Organizations: Yes    Attends Engineer, structural: More than 4 times per year    Marital Status: Married    Tobacco Counseling Counseling given: Not Answered   Clinical Intake:  Pre-visit preparation completed: Yes  Pain : No/denies pain  BMI - recorded: 26.69 Nutritional Status: BMI 25 -29 Overweight Nutritional Risks: None Diabetes: No  How often do you need to have someone help you when you read instructions, pamphlets, or other written  materials from your doctor or pharmacy?: 1 - Never  Activities of Daily Living    10/06/2022   11:09 AM  In your present state of health, do you have any difficulty performing the following activities:  Hearing? 0  Vision? 0  Comment wears readers  Difficulty concentrating or making decisions? 0  Walking or climbing stairs? 0  Dressing or bathing? 0  Doing errands, shopping? 0  Preparing Food and eating ? N  Using the Toilet? N  In the past six months, have you accidently leaked urine?  Y  Do you have problems with loss of bowel control? N  Managing your Medications? N  Managing your Finances? N  Housekeeping or managing your Housekeeping? N    Patient Care Team: Zola Button, Grayling Congress, DO as PCP - General Regal, Kirstie Peri, DPM as Consulting Physician (Podiatry) Bettey Costa, MD as Consulting Physician (Dermatology) Shon Millet, MD as Consulting Physician (Ophthalmology)  Indicate any recent Medical Services you may have received from other than [facility] providers in the past year (date may be approximate).     Assessment:   This is a routine wellness examination for [facility].  Hearing/Vision screen No results found.  Dietary issues and exercise activities discussed: Current Exercise Habits: Home exercise routine;Structured exercise class, Type of exercise: walking;Other - see comments (Silver Sneakers, water aerobics), Time (Minutes): 45, Frequency (Times/Week): 6, Weekly Exercise (Minutes/Week): 270, Intensity: Moderate, Exercise limited by: None identified   Goals Addressed   None    Depression Screen    10/06/2022   11:09 AM 04/10/2022    9:54 AM 10/03/2021    2:33 PM 09/27/2020   10:27 AM 09/19/2019   10:15 AM 03/06/2019    8:46 AM 02/26/2018    5:33 PM  PHQ 2/9 Scores  PHQ - 2 Score 0 0 0 0 0 0 0  PHQ- 9 Score       0    Fall Risk    10/06/2022   11:06 AM 04/10/2022    9:54 AM 10/03/2021    2:32 PM 09/27/2020   10:26 AM 09/19/2019   10:15 AM  Fall Risk    Falls in the past year? 0 0 0 0 0  Number falls in past yr: 0 0 0 0 0  Injury with Fall? 0 0 0 0 0  Risk for fall due to : No Fall Risks No Fall Risks No Fall Risks    Follow up Falls evaluation completed Falls evaluation completed Falls evaluation completed Falls prevention discussed Education provided;Falls prevention discussed    FALL RISK PREVENTION PERTAINING TO THE HOME:  Any stairs in or around the home? Yes  If so, are there any without handrails? Yes  Home free of loose throw rugs in walkways, pet beds, electrical cords, etc? Yes  Adequate lighting in your home to reduce risk of falls? Yes   ASSISTIVE DEVICES UTILIZED TO PREVENT FALLS:  Life alert? No  Use of a cane, walker or w/c? No  Grab bars in the bathroom? No  Shower chair or bench in shower?  Built in seat Elevated toilet seat or a handicapped toilet? No   TIMED UP AND GO:  Was the test performed? Yes .  Length of time to ambulate 10 feet: 6 sec.   Gait steady and fast without use of assistive device  Cognitive Function:    02/01/2017    3:51 PM  MMSE - Mini Mental State Exam  Orientation to time 5  Orientation to Place 5  Registration 3  Attention/ Calculation 5  Recall 3  Language- name 2 objects 2  Language- repeat 1  Language- follow 3 step command 3  Language- read & follow direction 1  Write a sentence 1  Copy design 1  Total score 30        10/06/2022   11:12 AM 10/03/2021    2:36 PM  6CIT Screen  What Year? 0 points 0 points  What month? 0 points 0 points  What time? 0 points 0 points  Count  back from 20 0 points 0 points  Months in reverse 0 points 0 points  Repeat phrase 0 points 0 points  Total Score 0 points 0 points    Immunizations Immunization History  Administered Date(s) Administered   Influenza Whole 01/26/2009, 02/19/2012   Influenza, High Dose Seasonal PF 01/15/2019   Influenza,inj,Quad PF,6+ Mos 02/18/2013, 01/30/2014, 02/01/2017   Influenza-Unspecified  01/23/2016, 02/12/2018, 01/26/2020, 01/17/2021, 02/10/2022   Moderna Covid-19 Vaccine Bivalent Booster 55yrs & up 01/17/2021, 02/27/2022   PFIZER(Purple Top)SARS-COV-2 Vaccination 06/07/2019, 06/30/2019, 01/29/2020   Pneumococcal Conjugate-13 02/01/2017   Pneumococcal Polysaccharide-23 02/26/2018   Td 11/22/2007   Tdap 11/22/2007, 02/27/2018   Zoster Recombinat (Shingrix) 12/21/2020, 03/03/2021   Zoster, Live 03/23/2014    TDAP status: Up to date  Flu Vaccine status: Up to date  Pneumococcal vaccine status: Up to date  Covid-19 vaccine status: Information provided on how to obtain vaccines.   Qualifies for Shingles Vaccine? Yes   Zostavax completed Yes   Shingrix Completed?: Yes  Screening Tests Health Maintenance  Topic Date Due   COVID-19 Vaccine (6 - 2023-24 season) 04/24/2022   Medicare Annual Wellness (AWV)  10/04/2022   Colonoscopy  11/01/2022   INFLUENZA VACCINE  11/23/2022   MAMMOGRAM  01/11/2023   DEXA SCAN  06/12/2024   DTaP/Tdap/Td (4 - Td or Tdap) 02/28/2028   Pneumonia Vaccine 65+ Years old  Completed   Hepatitis C Screening  Completed   Zoster Vaccines- Shingrix  Completed   HPV VACCINES  Aged Out    Health Maintenance  Health Maintenance Due  Topic Date Due   COVID-19 Vaccine (6 - 2023-24 season) 04/24/2022   Medicare Annual Wellness (AWV)  10/04/2022   Colonoscopy  11/01/2022    Colorectal cancer screening: Type of screening: Colonoscopy. Completed 10/31/12. Repeat every 10 years  Mammogram status: Completed 01/10/22. Repeat every year  Bone Density status: Completed 06/12/22. Results reflect: Bone density results: OSTEOPENIA. Repeat every 2 years.  Lung Cancer Screening: (Low Dose CT Chest recommended if Age 22-80 years, 30 pack-year currently smoking OR have quit w/in 15years.) does not qualify.    Additional Screening:  Hepatitis C Screening: does qualify; Completed 12/13/15  Vision Screening: Recommended annual ophthalmology exams for early  detection of glaucoma and other disorders of the eye. Is the patient up to date with their annual eye exam?  Yes  Who is the provider or what is the name of the office in which the patient attends annual eye exams? Dr.Gl[facility]. If pt is not established with a provider, would they like to be referred to a provider to establish care? No .   Dental Screening: Recommended annual dental exams for proper oral hygiene  Community Resource Referral / Chronic Care Management: CRR required this visit?  No   CCM required this visit?  No      Plan:     I have personally reviewed and noted the following in the patient's chart:   Medical and social history Use of alcohol, tobacco or illicit drugs  Current medications and supplements including opioid prescriptions. Patient is not currently taking opioid prescriptions. Functional ability and status Nutritional status Physical activity Advanced directives List of other physicians Hospitalizations, surgeries, and ER visits in previous 12 months Vitals Screenings to include cognitive, depression, and falls Referrals and appointments  In addition, I have reviewed and discussed with patient certain preventive protocols, quality metrics, and best practice recommendations. A written personalized care plan for preventive services as well as general  preventive health recommendations were provided to patient.     Donne Anon, [place]   10/06/2022   Nurse Notes: None

## 2022-10-06 NOTE — Patient Instructions (Signed)
Laura Price , Thank you for taking time to come for your Medicare Wellness Visit. I appreciate your ongoing commitment to your health goals. Please review the following plan we discussed and let me know if I can assist you in the future.     This is a list of the screening recommended for you and due dates:  Health Maintenance  Topic Date Due   COVID-19 Vaccine (6 - 2023-24 season) 04/24/2022   Colon Cancer Screening  11/01/2022   Flu Shot  11/23/2022   Mammogram  01/11/2023   Medicare Annual Wellness Visit  10/06/2023   DEXA scan (bone density measurement)  06/12/2024   DTaP/Tdap/Td vaccine (4 - Td or Tdap) 02/28/2028   Pneumonia Vaccine  Completed   Hepatitis C Screening  Completed   Zoster (Shingles) Vaccine  Completed   HPV Vaccine  Aged Out    Next appointment: Follow up in one year for your annual wellness visit.   Preventive Care 35 Years and Older, Female Preventive care refers to lifestyle choices and visits with your health care provider that can promote health and wellness. What does preventive care include? A yearly physical exam. This is also called an annual well check. Dental exams once or twice a year. Routine eye exams. Ask your health care provider how often you should have your eyes checked. Personal lifestyle choices, including: Daily care of your teeth and gums. Regular physical activity. Eating a healthy diet. Avoiding tobacco and drug use. Limiting alcohol use. Practicing safe sex. Taking low-dose aspirin every day. Taking vitamin and mineral supplements as recommended by your health care provider. What happens during an annual well check? The services and screenings done by your health care provider during your annual well check will depend on your age, overall health, lifestyle risk factors, and family history of disease. Counseling  Your health care provider may ask you questions about your: Alcohol use. Tobacco use. Drug use. Emotional  well-being. Home and relationship well-being. Sexual activity. Eating habits. History of falls. Memory and ability to understand (cognition). Work and work Astronomer. Reproductive health. Screening  You may have the following tests or measurements: Height, weight, and BMI. Blood pressure. Lipid and cholesterol levels. These may be checked every 5 years, or more frequently if you are over 1 years old. Skin check. Lung cancer screening. You may have this screening every year starting at age 41 if you have a 30-pack-year history of smoking and currently smoke or have quit within the past 15 years. Fecal occult blood test (FOBT) of the stool. You may have this test every year starting at age 24. Flexible sigmoidoscopy or colonoscopy. You may have a sigmoidoscopy every 5 years or a colonoscopy every 10 years starting at age 30. Hepatitis C blood test. Hepatitis B blood test. Sexually transmitted disease (STD) testing. Diabetes screening. This is done by checking your blood sugar (glucose) after you have not eaten for a while (fasting). You may have this done every 1-3 years. Bone density scan. This is done to screen for osteoporosis. You may have this done starting at age 63. Mammogram. This may be done every 1-2 years. Talk to your health care provider about how often you should have regular mammograms. Talk with your health care provider about your test results, treatment options, and if necessary, the need for more tests. Vaccines  Your health care provider may recommend certain vaccines, such as: Influenza vaccine. This is recommended every year. Tetanus, diphtheria, and acellular pertussis (Tdap, Td) vaccine.  You may need a Td booster every 10 years. Zoster vaccine. You may need this after age 18. Pneumococcal 13-valent conjugate (PCV13) vaccine. One dose is recommended after age 66. Pneumococcal polysaccharide (PPSV23) vaccine. One dose is recommended after age 12. Talk to your  health care provider about which screenings and vaccines you need and how often you need them. This information is not intended to replace advice given to you by your health care provider. Make sure you discuss any questions you have with your health care provider. Document Released: 05/07/2015 Document Revised: 12/29/2015 Document Reviewed: 02/09/2015 Elsevier Interactive Patient Education  2017 [place] Prevention in the Home Falls can cause injuries. They can happen to people of all ages. There are many things you can do to make your home safe and to help prevent falls. What can I do on the outside of my home? Regularly fix the edges of walkways and driveways and fix any cracks. Remove anything that might make you trip as you walk through a door, such as a raised step or threshold. Trim any bushes or trees on the path to your home. Use bright outdoor lighting. Clear any walking paths of anything that might make someone trip, such as rocks or tools. Regularly check to see if handrails are loose or broken. Make sure that both sides of any steps have handrails. Any raised decks and porches should have guardrails on the edges. Have any leaves, snow, or ice cleared regularly. Use sand or salt on walking paths during winter. Clean up any spills in your garage right away. This includes oil or grease spills. What can I do in the bathroom? Use night lights. Install grab bars by the toilet and in the tub and shower. Do not use towel bars as grab bars. Use non-skid mats or decals in the tub or shower. If you need to sit down in the shower, use a plastic, non-slip stool. Keep the floor dry. Clean up any water that spills on the floor as soon as it happens. Remove soap buildup in the tub or shower regularly. Attach bath mats securely with double-sided non-slip rug tape. Do not have throw rugs and other things on the floor that can make you trip. What can I do in the bedroom? Use night  lights. Make sure that you have a light by your bed that is easy to reach. Do not use any sheets or blankets that are too big for your bed. They should not hang down onto the floor. Have a firm chair that has side arms. You can use this for support while you get dressed. Do not have throw rugs and other things on the floor that can make you trip. What can I do in the kitchen? Clean up any spills right away. Avoid walking on wet floors. Keep items that you use a lot in easy-to-reach places. If you need to reach something above you, use a strong step stool that has a grab bar. Keep electrical cords out of the way. Do not use floor polish or wax that makes floors slippery. If you must use wax, use non-skid floor wax. Do not have throw rugs and other things on the floor that can make you trip. What can I do with my stairs? Do not leave any items on the stairs. Make sure that there are handrails on both sides of the stairs and use them. Fix handrails that are broken or loose. Make sure that handrails are as long as  the stairways. Check any carpeting to make sure that it is firmly attached to the stairs. Fix any carpet that is loose or worn. Avoid having throw rugs at the top or bottom of the stairs. If you do have throw rugs, attach them to the floor with carpet tape. Make sure that you have a light switch at the top of the stairs and the bottom of the stairs. If you do not have them, ask someone to add them for you. What else can I do to help prevent falls? Wear shoes that: Do not have high heels. Have rubber bottoms. Are comfortable and fit you well. Are closed at the toe. Do not wear sandals. If you use a stepladder: Make sure that it is fully opened. Do not climb a closed stepladder. Make sure that both sides of the stepladder are locked into place. Ask someone to hold it for you, if possible. Clearly mark and make sure that you can see: Any grab bars or handrails. First and last  steps. Where the edge of each step is. Use tools that help you move around (mobility aids) if they are needed. These include: Canes. Walkers. Scooters. Crutches. Turn on the lights when you go into a dark area. Replace any light bulbs as soon as they burn out. Set up your furniture so you have a clear path. Avoid moving your furniture around. If any of your floors are uneven, fix them. If there are any pets around you, be aware of where they are. Review your medicines with your doctor. Some medicines can make you feel dizzy. This can increase your chance of falling. Ask your doctor what other things that you can do to help prevent falls. This information is not intended to replace advice given to you by your health care provider. Make sure you discuss any questions you have with your health care provider. Document Released: 02/04/2009 Document Revised: 09/16/2015 Document Reviewed: 05/15/2014 Elsevier Interactive Patient Education  2017 [employer].

## 2022-11-27 ENCOUNTER — Other Ambulatory Visit: Payer: Self-pay | Admitting: Family Medicine

## 2022-11-27 DIAGNOSIS — Z1231 Encounter for screening mammogram for malignant neoplasm of breast: Secondary | ICD-10-CM

## 2022-11-29 ENCOUNTER — Encounter: Payer: Self-pay | Admitting: Gastroenterology

## 2023-01-10 ENCOUNTER — Ambulatory Visit (AMBULATORY_SURGERY_CENTER): Payer: PPO

## 2023-01-10 ENCOUNTER — Other Ambulatory Visit: Payer: Self-pay

## 2023-01-10 ENCOUNTER — Encounter: Payer: Self-pay | Admitting: Gastroenterology

## 2023-01-10 VITALS — Ht 65.0 in | Wt 162.4 lb

## 2023-01-10 DIAGNOSIS — Z1211 Encounter for screening for malignant neoplasm of colon: Secondary | ICD-10-CM

## 2023-01-10 MED ORDER — NA SULFATE-K SULFATE-MG SULF 17.5-3.13-1.6 GM/177ML PO SOLN: 1.0000 | Freq: Once | ORAL | 0 refills | Status: AC

## 2023-01-10 NOTE — Progress Notes (Signed)

## 2023-01-12 ENCOUNTER — Ambulatory Visit
Admission: RE | Admit: 2023-01-12 | Discharge: 2023-01-12 | Disposition: A | Discharge status: Home / Self Care | Payer: PPO | Source: Ambulatory Visit | Attending: Family Medicine | Admitting: Family Medicine

## 2023-01-12 DIAGNOSIS — Z1231 Encounter for screening mammogram for malignant neoplasm of breast: Secondary | ICD-10-CM

## 2023-01-24 ENCOUNTER — Ambulatory Visit: Payer: PPO | Admitting: Gastroenterology

## 2023-01-24 ENCOUNTER — Encounter: Payer: Self-pay | Admitting: Gastroenterology

## 2023-01-24 VITALS — BP 115/70 | HR 61 | Temp 97.4°F | Resp 16 | Ht 65.0 in | Wt 162.4 lb

## 2023-01-24 DIAGNOSIS — Z1211 Encounter for screening for malignant neoplasm of colon: Secondary | ICD-10-CM | POA: Diagnosis not present

## 2023-01-24 DIAGNOSIS — D122 Benign neoplasm of ascending colon: Secondary | ICD-10-CM

## 2023-01-24 DIAGNOSIS — D125 Benign neoplasm of sigmoid colon: Secondary | ICD-10-CM | POA: Diagnosis not present

## 2023-01-24 DIAGNOSIS — D124 Benign neoplasm of descending colon: Secondary | ICD-10-CM | POA: Diagnosis not present

## 2023-01-24 MED ORDER — SODIUM CHLORIDE 0.9 % IV SOLN: 500.0000 mL | INTRAVENOUS | Status: DC

## 2023-01-24 NOTE — Progress Notes (Signed)
Uneventful anesthetic. Report to pacu rn. Vss. Care resumed by rn. 

## 2023-01-24 NOTE — Progress Notes (Signed)
Pt's states no medical or surgical changes since previsit or office visit. 

## 2023-01-24 NOTE — Progress Notes (Signed)
[facility] History and Physical   Primary Care Physician:  Zola Button, Grayling Congress, DO   Reason for Procedure:   Colon cancer screening  Plan:    colonoscopy     HPI: Laura Price is a 72 y.o. female  here for colonoscopy, last exam 10/2012.Laura Price   Patient denies any bowel symptoms at this time. No family history of colon cancer known. Otherwise feels well without any cardiopulmonary symptoms.   I have discussed risks / benefits of anesthesia and endoscopic procedure with [name] and they wish to proceed with the exams as outlined today.    Past Medical History:  Diagnosis Date   Allergic rhinitis    Allergy    Sulfa   Anemia 2005   Resolved following hysterectomy   Arthritis 2009   Neck, right big toe joint, fingers, hips   Cataract 2018   surgery 08/19/19 and 09/02/19   Heart murmur Birth   Resolved at 3 years    Past Surgical History:  Procedure Laterality Date   ABDOMINAL HYSTERECTOMY  2005   secondary to fibroid tumors and  significant bleeding/anemia   BREAST LUMPECTOMY Left    fibroadenoma L breast   EYE SURGERY Bilateral 09/02/2019, 08/18/2019   cataract sx Dr.Calynn Ferrero Sherrine Maples      MYRINGOTOMY     Left   TOTAL ABDOMINAL HYSTERECTOMY W/ BILATERAL SALPINGOOPHORECTOMY      Prior to Admission medications   Medication Sig Start Date End Date Taking? Authorizing Provider  acetaminophen (TYLENOL) 500 MG tablet Take 500 mg by mouth every 6 (six) hours as needed.   Yes [provider]  calcium elemental as carbonate (TUMS ULTRA 1000) 400 MG chewable tablet Chew 1,000 mg by mouth daily.   Yes [provider]  Cholecalciferol (VITAMIN D3) 50 MCG (2000 UT) capsule Take 2,000 Units by mouth daily.   Yes [provider]  fluticasone (FLONASE) 50 MCG/ACT nasal spray Place 2 sprays into both nostrils daily.   Yes [provider]  Multiple Minerals-Vitamins (CALCIUM-MAGNESIUM-ZINC-D3 PO) Take 2 tablets by  mouth daily.   Yes [provider]  Multiple Vitamins-Minerals (MULTIVITAMIN GUMMIES WOMENS) CHEW Chew 2 tablets by mouth daily.   Yes [provider]    Current Outpatient Medications  Medication Sig Dispense Refill   acetaminophen (TYLENOL) 500 MG tablet Take 500 mg by mouth every 6 (six) hours as needed.     calcium elemental as carbonate (TUMS ULTRA 1000) 400 MG chewable tablet Chew 1,000 mg by mouth daily.     Cholecalciferol (VITAMIN D3) 50 MCG (2000 UT) capsule Take 2,000 Units by mouth daily.     fluticasone (FLONASE) 50 MCG/ACT nasal spray Place 2 sprays into both nostrils daily.     Multiple Minerals-Vitamins (CALCIUM-MAGNESIUM-ZINC-D3 PO) Take 2 tablets by mouth daily.     Multiple Vitamins-Minerals (MULTIVITAMIN GUMMIES WOMENS) CHEW Chew 2 tablets by mouth daily.     Current Facility-Administered Medications  Medication Dose Route Frequency Provider Last Rate Last Admin   0.9 %  sodium chloride infusion  500 mL Intravenous Continuous Skylan Lara, Willaim Rayas, MD        Allergies as of 01/24/2023 - Review Complete 01/24/2023  Allergen Reaction Noted   Sulfonamide derivatives Swelling and Rash     Family History  Problem Relation Age of Onset   Atrial fibrillation Mother    Breast cancer Mother 35   Thyroid disease Mother    Cancer Mother 69       prostate  Heart disease Mother        a fib   Hypertension Mother    Varicose Veins Mother    Prostate cancer Father    Hypertension Father    Cancer Father 42       prostate   Colon polyps Sister    Thyroid disease Sister    Cancer Sister    Thyroid disease Brother    Arthritis Brother    Osteoarthritis Other 30   Colon cancer Neg Hx    Esophageal cancer Neg Hx    Rectal cancer Neg Hx    Stomach cancer Neg Hx     Social History   Socioeconomic History   Marital status: Married    Spouse name: Not on file   Number of children: Not on file   Years of education: Not on file   Highest education  level: Not on file  Occupational History   Occupation: [employer]    Employer: [employer]    Comment: PT   Occupation: retired    Comment: 2018  Tobacco Use   Smoking status: Never   Smokeless tobacco: Never  Vaping Use   Vaping status: Never Used  Substance and Sexual Activity   Alcohol use: Yes    Alcohol/week: 3.0 standard drinks of alcohol    Types: 3 Glasses of wine per week   Drug use: No   Sexual activity: Not Currently    Partners: Male    Birth control/protection: Post-menopausal  Other Topics Concern   Not on file  Social History Narrative   Exercise-- walk almost everyday, water aerobics    Social Determinants of Health   Financial Resource Strain: Low Risk  (09/27/2020)   Overall Financial Resource Strain (CARDIA)    Difficulty of Paying Living Expenses: Not hard at all  Food Insecurity: No Food Insecurity (10/06/2022)   Hunger Vital Sign    Worried About Running Out of Food in the Last Year: Never true    Ran Out of Food in the Last Year: Never true  Transportation Needs: No Transportation Needs (10/06/2022)   PRAPARE - Administrator, Civil Service (Medical): No    Lack of Transportation (Non-Medical): No  Physical Activity: Sufficiently Active (09/27/2020)   Exercise Vital Sign    Days of Exercise per Week: 4 days    Minutes of Exercise per Session: 40 min  Stress: No Stress Concern Present (09/27/2020)   [employer] of Occupational Health - Occupational Stress Questionnaire    Feeling of Stress : Not at all  Social Connections: Socially Integrated (09/27/2020)   Social Connection and Isolation Panel [NHANES]    Frequency of Communication with Friends and Family: More than three times a week    Frequency of Social Gatherings with Friends and Family: More than three times a week    Attends Religious Services: More than 4 times per year    Active Member of [employer] or Organizations: Yes    Attends Hospital doctor: More than 4 times per year    Marital Status: Married  Catering manager Violence: Not At Risk (10/06/2022)   Humiliation, Afraid, Rape, and Kick questionnaire    Fear of Current or Ex-Partner: No    Emotionally Abused: No    Physically Abused: No    Sexually Abused: No    Review of Systems: All other review of systems negative except as mentioned in the HPI.  Physical Exam: Vital signs BP (!) 151/69  Pulse 62   Temp (!) 97.4 F (36.3 C)   Resp 13   Ht 5\' 5"  (1.651 m)   Wt 162 lb 6.4 oz (73.7 kg)   SpO2 99%   BMI 27.02 kg/m   General:   Alert,  Well-developed, pleasant and cooperative in NAD Lungs:  Clear throughout to auscultation.   Heart:  Regular rate and rhythm Abdomen:  Soft, nontender and nondistended.   Neuro/Psych:  Alert and cooperative. Normal mood and affect. A and O x 3  Harlin Rain, MD [facility]

## 2023-01-24 NOTE — Op Note (Signed)
[facility] Patient Name: Laura Price Procedure Date: 01/24/2023 8:36 AM MRN: [id number] Endoscopist: Viviann Spare P. Adela Lank , MD, [id number] Age: 72 Referring MD:  Date of Birth: [birth year] Gender: Female Account #: [id number] Procedure:                Colonoscopy Indications:              Screening for colorectal malignant neoplasm Medicines:                Monitored Anesthesia Care Procedure:                Pre-Anesthesia Assessment:                           - Prior to the procedure, a History and Physical                            was performed, and patient medications and                            allergies were reviewed. The patient's tolerance of                            previous anesthesia was also reviewed. The risks                            and benefits of the procedure and the sedation                            options and risks were discussed with the patient.                            All questions were answered, and informed consent                            was obtained. Prior Anticoagulants: The patient has                            taken no anticoagulant or antiplatelet agents. ASA                            Grade Assessment: II - A patient with mild systemic                            disease. After reviewing the risks and benefits,                            the patient was deemed in satisfactory condition to                            undergo the procedure.                           After obtaining informed consent, the colonoscope  was passed under direct vision. Throughout the                            procedure, the patient's blood pressure, pulse, and                            oxygen saturations were monitored continuously. The                            Olympus PCF-H190DL (#[id number]) Colonoscope was                            introduced through the anus and advanced to the the                             cecum, identified by appendiceal orifice and                            ileocecal valve. The colonoscopy was performed                            without difficulty. The patient tolerated the                            procedure well. The quality of the bowel                            preparation was good. The ileocecal valve,                            appendiceal orifice, and rectum were photographed. Scope In: 8:50:04 AM Scope Out: 9:05:43 AM Scope Withdrawal Time: 0 hours 9 minutes 48 seconds  Total Procedure Duration: 0 hours 15 minutes 39 seconds  Findings:                 The perianal and digital rectal examinations were                            normal.                           A 2 to 3 mm polyp was found in the ascending colon.                            The polyp was sessile. The polyp was removed with a                            cold snare. Resection and retrieval were complete.                           A 3 mm polyp was found in the descending colon. The                            polyp was sessile. The polyp  was removed with a                            cold snare. Resection and retrieval were complete.                           Two sessile polyps were found in the sigmoid colon.                            The polyps were 3 to 4 mm in size. These polyps                            were removed with a cold snare. Resection and                            retrieval were complete.                           Multiple small-mouthed diverticula were found in                            the sigmoid colon.                           Internal hemorrhoids were found during                            retroflexion. The hemorrhoids were small.                           The exam was otherwise without abnormality. Complications:            No immediate complications. Estimated blood loss:                            Minimal. Estimated Blood Loss:     Estimated blood loss was  minimal. Impression:               - One 2 to 3 mm polyp in the ascending colon,                            removed with a cold snare. Resected and retrieved.                           - One 3 mm polyp in the descending colon, removed                            with a cold snare. Resected and retrieved.                           - Two 3 to 4 mm polyps in the sigmoid colon,                            removed with a cold snare. Resected and retrieved.                           -  Diverticulosis in the sigmoid colon.                           - Internal hemorrhoids.                           - The examination was otherwise normal. Recommendation:           - Patient has a contact number available for                            emergencies. The signs and symptoms of potential                            delayed complications were discussed with the                            patient. Return to normal activities tomorrow.                            Written discharge instructions were provided to the                            patient.                           - Resume previous diet.                           - Continue present medications.                           - Await pathology results. Viviann Spare P. Lynita Groseclose, MD 01/24/2023 9:10:13 AM This report has been signed electronically.

## 2023-01-24 NOTE — Progress Notes (Signed)
Called to room to assist during endoscopic procedure.  Patient ID and intended procedure confirmed with present staff. Received instructions for my participation in the procedure from the performing physician.  

## 2023-01-24 NOTE — Patient Instructions (Signed)
Discharge instructions given. Handouts on polyps,Diverticulosis and Hemorrhoids. Resume previous medications. YOU HAD AN ENDOSCOPIC PROCEDURE TODAY AT [facility]:   Refer to the procedure report that was given to you for any specific questions about what was found during the examination.  If the procedure report does not answer your questions, please call your gastroenterologist to clarify.  If you requested that your care partner not be given the details of your procedure findings, then the procedure report has been included in a sealed envelope for you to review at your convenience later.  YOU SHOULD EXPECT: Some feelings of bloating in the abdomen. Passage of more gas than usual.  Walking can help get rid of the air that was put into your GI tract during the procedure and reduce the bloating. If you had a lower endoscopy (such as a colonoscopy or flexible sigmoidoscopy) you may notice spotting of blood in your stool or on the toilet paper. If you underwent a bowel prep for your procedure, you may not have a normal bowel movement for a few days.  Please Note:  You might notice some irritation and congestion in your nose or some drainage.  This is from the oxygen used during your procedure.  There is no need for concern and it should clear up in a day or so.  SYMPTOMS TO REPORT IMMEDIATELY:  Following lower endoscopy (colonoscopy or flexible sigmoidoscopy):  Excessive amounts of blood in the stool  Significant tenderness or worsening of abdominal pains  Swelling of the abdomen that is new, acute  Fever of 100F or higher   For urgent or emergent issues, a gastroenterologist can be reached at any hour by calling [phone]. Do not use MyChart messaging for urgent concerns.    DIET:  We do recommend a small meal at first, but then you may proceed to your regular diet.  Drink plenty of fluids but you should avoid alcoholic beverages for 24 hours.  ACTIVITY:  You should  plan to take it easy for the rest of today and you should NOT DRIVE or use heavy machinery until tomorrow (because of the sedation medicines used during the test).    FOLLOW UP: Our staff will call the number listed on your records the next business day following your procedure.  We will call around 7:15- 8:00 am to check on you and address any questions or concerns that you may have regarding the information given to you following your procedure. If we do not reach you, we will leave a message.     If any biopsies were taken you will be contacted by phone or by letter within the next 1-3 weeks.  Please call us at [phone] if you have not heard about the biopsies in 3 weeks.    SIGNATURES/CONFIDENTIALITY: You and/or your care partner have signed paperwork which will be entered into your electronic medical record.  These signatures attest to the fact that that the information above on your After Visit Summary has been reviewed and is understood.  Full responsibility of the confidentiality of this discharge information lies with you and/or your care-partner.

## 2023-01-25 ENCOUNTER — Telehealth: Payer: Self-pay

## 2023-01-25 NOTE — Telephone Encounter (Signed)
Left message on follow up call. 

## 2023-01-26 LAB — SURGICAL PATHOLOGY

## 2023-04-13 ENCOUNTER — Encounter: Payer: Self-pay | Admitting: Family Medicine

## 2023-04-13 ENCOUNTER — Ambulatory Visit (INDEPENDENT_AMBULATORY_CARE_PROVIDER_SITE_OTHER): Payer: PPO | Admitting: Family Medicine

## 2023-04-13 VITALS — BP 134/70 | HR 78 | Temp 97.8°F | Resp 16 | Ht 65.0 in | Wt 160.8 lb

## 2023-04-13 DIAGNOSIS — Z136 Encounter for screening for cardiovascular disorders: Secondary | ICD-10-CM | POA: Diagnosis not present

## 2023-04-13 DIAGNOSIS — E559 Vitamin D deficiency, unspecified: Secondary | ICD-10-CM | POA: Diagnosis not present

## 2023-04-13 DIAGNOSIS — Z0000 Encounter for general adult medical examination without abnormal findings: Secondary | ICD-10-CM

## 2023-04-13 LAB — CBC WITH DIFFERENTIAL/PLATELET
Basophils Absolute: 0.1 10*3/uL (ref 0.0–0.1)
Basophils Relative: 1.2 % (ref 0.0–3.0)
Eosinophils Absolute: 0.1 10*3/uL (ref 0.0–0.7)
Eosinophils Relative: 1.4 % (ref 0.0–5.0)
HCT: 41.6 % (ref 36.0–46.0)
Hemoglobin: 13.8 g/dL (ref 12.0–15.0)
Lymphocytes Relative: 21.8 % (ref 12.0–46.0)
Lymphs Abs: 1.1 10*3/uL (ref 0.7–4.0)
MCHC: 33.1 g/dL (ref 30.0–36.0)
MCV: 88.5 fL (ref 78.0–100.0)
Monocytes Absolute: 0.5 10*3/uL (ref 0.1–1.0)
Monocytes Relative: 9.2 % (ref 3.0–12.0)
Neutro Abs: 3.4 10*3/uL (ref 1.4–7.7)
Neutrophils Relative %: 66.4 % (ref 43.0–77.0)
Platelets: 260 10*3/uL (ref 150.0–400.0)
RBC: 4.7 Mil/uL (ref 3.87–5.11)
RDW: 14.2 % (ref 11.5–15.5)
WBC: 5.1 10*3/uL (ref 4.0–10.5)

## 2023-04-13 LAB — LIPID PANEL
Cholesterol: 172 mg/dL (ref 0–200)
HDL: 77.7 mg/dL (ref >39.00)
LDL Cholesterol: 85 mg/dL (ref 0–99)
NonHDL: 93.89
Total CHOL/HDL Ratio: 2
Triglycerides: 46 mg/dL (ref 0.0–149.0)
VLDL: 9.2 mg/dL (ref 0.0–40.0)

## 2023-04-13 LAB — COMPREHENSIVE METABOLIC PANEL
ALT: 16 U/L (ref 0–35)
AST: 19 U/L (ref 0–37)
Albumin: 4.2 g/dL (ref 3.5–5.2)
Alkaline Phosphatase: 97 U/L (ref 39–117)
BUN: 13 mg/dL (ref 6–23)
CO2: 27 meq/L (ref 19–32)
Calcium: 8.9 mg/dL (ref 8.4–10.5)
Chloride: 98 meq/L (ref 96–112)
Creatinine, Ser: 0.56 mg/dL (ref 0.40–1.20)
GFR: 91.76 mL/min (ref >60.00)
Glucose, Bld: 99 mg/dL (ref 70–99)
Potassium: 4.5 meq/L (ref 3.5–5.1)
Sodium: 133 meq/L — ABNORMAL LOW (ref 135–145)
Total Bilirubin: 0.6 mg/dL (ref 0.2–1.2)
Total Protein: 6.5 g/dL (ref 6.0–8.3)

## 2023-04-13 LAB — VITAMIN D 25 HYDROXY (VIT D DEFICIENCY, FRACTURES): VITD: 63.42 ng/mL (ref 30.00–100.00)

## 2023-04-13 NOTE — Progress Notes (Signed)
Established Patient Office Visit  Subjective   Patient ID: Laura Price, female    DOB: 1950/05/19  Age: 72 y.o. MRN: [id number]  Chief Complaint  Patient presents with   Annual Exam    Pt states fasting     HPI Discussed the use of AI scribe software for clinical note transcription with the patient, who gave verbal consent to proceed.  History of Present Illness   The patient, a 72 year old with a history of hip bursitis, presents for a routine physical examination. She reports no significant changes in her health status since the last visit. However, she notes a recent diagnosis of well-differentiated neuroendocrine tumors in a younger sister, which has been responsive to monthly injections.  The patient is retired but maintains an active lifestyle, volunteering at [employer], participating in a Geneticist, molecular, and attending water aerobics at the [employer]. She also participates in a prayer shawl group at her church.  The patient reports regular exercise, including walking and water aerobics. She has regular eye and dental checkups, with a recent dental visit last week. Her last colonoscopy was in October, during which a couple of polyps were removed, and she is due for a follow-up in five years.  The patient had seen an orthopedist last year due to hip problems, which had resolved by the time of the orthopedic visit. The hip joints were reported to be in good condition, and the issue was attributed to possible bursitis.  The patient is not currently on any prescription medications, with all treatments being over-the-counter. She has noticed an increase in her systolic blood pressure since menopause, with readings often in the upper 120s to 130, while the diastolic pressure remains low.       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   History of shingles 06/13/2012   ALLERGIC RHINITIS 07/09/2007   Past Medical History:  Diagnosis Date   Allergic rhinitis    Allergy    Sulfa   Anemia 2005   Resolved following hysterectomy   Arthritis 2009   Neck, right big toe joint, fingers, hips   Cataract 2018   surgery 08/19/19 and 09/02/19   Heart murmur Birth   Resolved at 3 years   Past Surgical History:  Procedure Laterality Date   ABDOMINAL HYSTERECTOMY  2005   secondary to fibroid tumors and  significant bleeding/anemia   BREAST LUMPECTOMY Left    fibroadenoma L breast   EYE SURGERY Bilateral 09/02/2019, 08/18/2019   cataract sx Dr.Steven Sherrine Maples      MYRINGOTOMY     Left   TOTAL ABDOMINAL HYSTERECTOMY W/ BILATERAL SALPINGOOPHORECTOMY     Social History   Tobacco Use   Smoking status: Never   Smokeless tobacco: Never  Vaping Use   Vaping status: Never Used  Substance Use Topics   Alcohol use: Yes    Alcohol/week: 3.0 standard drinks of alcohol    Types: 3 Glasses of wine per week   Drug use: No   Social History   Socioeconomic History   Marital status: Married    Spouse name: Not on file   Number of children: Not on file  Years of education: Not on file   Highest education level: Not on file  Occupational History   Occupation: [employer]    Employer: [employer]    Comment: PT   Occupation: retired    Comment: 2018  Tobacco Use   Smoking status: Never   Smokeless tobacco: Never  Vaping Use   Vaping status: Never Used  Substance and Sexual Activity   Alcohol use: Yes    Alcohol/week: 3.0 standard drinks of alcohol    Types: 3 Glasses of wine per week   Drug use: No   Sexual activity: Not Currently    Partners: Male    Birth control/protection: Post-menopausal   Other Topics Concern   Not on file  Social History Narrative   Exercise-- walk almost everyday, water aerobics    Social Drivers of Health   Financial Resource Strain: Low Risk  (09/27/2020)   Overall Financial Resource Strain (CARDIA)    Difficulty of Paying Living Expenses: Not hard at all  Food Insecurity: No Food Insecurity (10/06/2022)   Hunger Vital Sign    Worried About Running Out of Food in the Last Year: Never true    Ran Out of Food in the Last Year: Never true  Transportation Needs: No Transportation Needs (10/06/2022)   PRAPARE - Administrator, Civil Service (Medical): No    Lack of Transportation (Non-Medical): No  Physical Activity: Sufficiently Active (09/27/2020)   Exercise Vital Sign    Days of Exercise per Week: 4 days    Minutes of Exercise per Session: 40 min  Stress: No Stress Concern Present (09/27/2020)   [employer] of Occupational Health - Occupational Stress Questionnaire    Feeling of Stress : Not at all  Social Connections: Socially Integrated (09/27/2020)   Social Connection and Isolation Panel [NHANES]    Frequency of Communication with Friends and Family: More than three times a week    Frequency of Social Gatherings with Friends and Family: More than three times a week    Attends Religious Services: More than 4 times per year    Active Member of [employer] or Organizations: Yes    Attends Banker Meetings: More than 4 times per year    Marital Status: Married  Catering manager Violence: Not At Risk (10/06/2022)   Humiliation, Afraid, Rape, and Kick questionnaire    Fear of Current or Ex-Partner: No    Emotionally Abused: No    Physically Abused: No    Sexually Abused: No   Family Status  Relation Name Status   Mother Mother Alive       breast ca   Father Father Deceased at age 45       metastatic prostate   Sister Middle sister Alive   Sister  Alive   Sister  Alive   Brother Brother Alive   Other  (Not Specified)    Neg Hx  (Not Specified)  No partnership data on file   Family History  Problem Relation Age of Onset   Atrial fibrillation Mother    Breast cancer Mother 41   Thyroid disease Mother    Cancer Mother 15       prostate   Heart disease Mother        a fib   Hypertension Mother    Varicose Veins Mother    Prostate cancer Father    Hypertension Father    Cancer Father 71       prostate  Cancer Sister 49       neuroendocrine tumor   Colon polyps Sister    Thyroid disease Sister    Thyroid disease Brother    Arthritis Brother    Osteoarthritis Other 43   Colon cancer Neg Hx    Esophageal cancer Neg Hx    Rectal cancer Neg Hx    Stomach cancer Neg Hx    Allergies  Allergen Reactions   Sulfonamide Derivatives Swelling and Rash      ROS    Objective:     BP 134/70 (BP Location: Left Arm, Patient Position: Sitting, Cuff Size: Normal)   Pulse 78   Temp 97.8 F (36.6 C) (Oral)   Resp 16   Ht 5\' 5"  (1.651 m)   Wt 160 lb 12.8 oz (72.9 kg)   BMI 26.76 kg/m  BP Readings from Last 3 Encounters:  04/13/23 134/70  01/24/23 115/70  10/06/22 (!) 154/66   Wt Readings from Last 3 Encounters:  04/13/23 160 lb 12.8 oz (72.9 kg)  01/24/23 162 lb 6.4 oz (73.7 kg)  01/10/23 162 lb 6.4 oz (73.7 kg)   SpO2 Readings from Last 3 Encounters:  01/24/23 97%  04/10/22 97%  10/03/21 98%      Physical Exam   No results found for any visits on 04/13/23.  Last CBC Lab Results  Component Value Date   WBC 5.5 04/10/2022   HGB 13.8 04/10/2022   HCT 40.9 04/10/2022   MCV 87.3 04/10/2022   RDW 14.0 04/10/2022   PLT 287.0 04/10/2022   Last metabolic panel Lab Results  Component Value Date   GLUCOSE 101 (H) 04/10/2022   NA 136 04/10/2022   K 4.5 04/10/2022   CL 100 04/10/2022   CO2 28 04/10/2022   BUN 12 04/10/2022   CREATININE 0.60 04/10/2022   GFR 90.89 04/10/2022   CALCIUM 9.4 04/10/2022   PROT 6.9 04/10/2022   ALBUMIN 4.3 04/10/2022   BILITOT 0.6 04/10/2022    ALKPHOS 94 04/10/2022   AST 16 04/10/2022   ALT 14 04/10/2022   Last lipids Lab Results  Component Value Date   CHOL 179 04/10/2022   HDL 86.40 04/10/2022   LDLCALC 83 04/10/2022   TRIG 52.0 04/10/2022   CHOLHDL 2 04/10/2022   Last hemoglobin A1c No results found for: "HGBA1C" Last thyroid functions Lab Results  Component Value Date   TSH 1.39 04/10/2022   T4TOTAL 8.4 03/06/2019   Last vitamin D Lab Results  Component Value Date   VD25OH 36.02 03/11/2020   Last vitamin B12 and Folate Lab Results  Component Value Date   VITAMINB12 744 03/14/2021      The 10-year ASCVD risk score (Arnett DK, et al., 2019) is: 10.5%    Assessment & Plan:   Problem List Items Addressed This Visit       Unprioritized   Preventative health care - Primary   Ghm utd Check labs  See AVS Health Maintenance  Topic Date Due   Medicare Annual Wellness (AWV)  10/06/2023   MAMMOGRAM  01/12/2024   Colonoscopy  01/24/2028   DTaP/Tdap/Td (4 - Td or Tdap) 02/28/2028   Pneumonia Vaccine 5+ Years old  Completed   INFLUENZA VACCINE  Completed   DEXA SCAN  Completed   COVID-19 Vaccine  Completed   Hepatitis C Screening  Completed   Zoster Vaccines- Shingrix  Completed   HPV VACCINES  Aged Out         Other Visit Diagnoses  Vitamin D deficiency       Relevant Orders   VITAMIN D 25 Hydroxy (Vit-D Deficiency, Fractures)     Assessment and Plan    General Health Maintenance During her annual physical examination, she reported no significant changes in family history or surgeries. Her sister is responding to monthly injections for well-differentiated neuroendocrine tumors. 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 She is up-to-date with eye and dental check-ups, and her recent colonoscopy in October involved polyp removal, with the next one due in five years. There are no new dermatological concerns, and previous hip pain, likely bursitis, has  resolved. She has slightly elevated blood pressure, for which the risks of treatment, including potential dizziness and syncope if hypotension occurs, were discussed. We will order blood work, monitor her blood pressure, and advise her to continue regular exercise and volunteer activities. A follow-up colonoscopy is scheduled in five years.        No follow-ups on file.    Donato Schultz, DO

## 2023-04-13 NOTE — Assessment & Plan Note (Signed)
Ghm utd Check labs  See AVS Health Maintenance  Topic Date Due   Medicare Annual Wellness (AWV)  10/06/2023   MAMMOGRAM  01/12/2024   Colonoscopy  01/24/2028   DTaP/Tdap/Td (4 - Td or Tdap) 02/28/2028   Pneumonia Vaccine 57+ Years old  Completed   INFLUENZA VACCINE  Completed   DEXA SCAN  Completed   COVID-19 Vaccine  Completed   Hepatitis C Screening  Completed   Zoster Vaccines- Shingrix  Completed   HPV VACCINES  Aged Out

## 2023-08-03 DIAGNOSIS — H43812 Vitreous degeneration, left eye: Secondary | ICD-10-CM | POA: Diagnosis not present

## 2023-08-03 DIAGNOSIS — Z961 Presence of intraocular lens: Secondary | ICD-10-CM | POA: Diagnosis not present

## 2023-08-03 DIAGNOSIS — H26493 Other secondary cataract, bilateral: Secondary | ICD-10-CM | POA: Diagnosis not present

## 2023-08-03 DIAGNOSIS — H35371 Puckering of macula, right eye: Secondary | ICD-10-CM | POA: Diagnosis not present

## 2023-10-12 ENCOUNTER — Ambulatory Visit: Admitting: *Deleted

## 2023-10-12 ENCOUNTER — Telehealth: Payer: Self-pay | Admitting: *Deleted

## 2023-10-12 VITALS — BP 151/60 | HR 66 | Temp 97.8°F | Resp 16 | Ht 64.5 in | Wt 161.8 lb

## 2023-10-12 DIAGNOSIS — Z0000 Encounter for general adult medical examination without abnormal findings: Secondary | ICD-10-CM | POA: Diagnosis not present

## 2023-10-12 NOTE — Telephone Encounter (Signed)
 Pt had AWV today and BP was elevated at 148/62 and 151/60 on recheck. She states home readings are 130s/60s. She has cpe with you on 04/29/24.  Please advise?

## 2023-10-12 NOTE — Progress Notes (Signed)
 Subjective:   Laura Price is a 73 y.o. who presents for a Medicare Wellness preventive visit.  As a reminder, Annual Wellness Visits don't include a physical exam, and some assessments may be limited, especially if this visit is performed virtually. We may recommend an in-person follow-up visit with your provider if needed.  Visit Complete: In person  Persons Participating in Visit: Patient.  AWV Questionnaire: No: Patient Medicare AWV questionnaire was not completed prior to this visit.  Cardiac Risk Factors include: advanced age (>54men, >77 women)     Objective:    Today's Vitals   10/12/23 0948 10/12/23 1024  BP: (!) 148/62 (!) 151/60  Pulse: 66   Resp: 16   Temp: 97.8 F (36.6 C)   TempSrc: Oral   SpO2: 100%   Weight: 161 lb 12.8 oz (73.4 kg)   Height: 5' 4.5 (1.638 m)    Body mass index is 27.34 kg/m.     10/12/2023   10:11 AM 10/06/2022   11:01 AM 10/03/2021    2:31 PM 09/27/2020   10:24 AM 09/19/2019   10:13 AM 03/06/2018    8:34 AM 02/01/2017    3:55 PM  Advanced Directives  Does Patient Have a Medical Advance Directive? Yes Yes Yes Yes Yes Yes  No   Type of Estate agent of [employer] Power of [place];Living will Healthcare Power of [place];Out of facility DNR (pink MOST or yellow form);Living will Healthcare Power of [place];Living will Healthcare Power of [place];Living will Healthcare Power of [place];Living will   Does patient want to make changes to medical advance directive? No - Patient declined No - Patient declined No - Patient declined  No - Patient declined No - Patient declined    Copy of Healthcare Power of Attorney in Chart? Yes - validated most recent copy scanned in chart (See row information)  Yes - validated most recent copy scanned in chart (See row information) Yes - validated most recent copy scanned in chart (See row information) Yes - validated most recent copy scanned in chart (See row  information) Yes - validated most recent copy scanned in chart (See row information)    Would patient like information on creating a medical advance directive?       No - Patient declined      Data saved with a previous flowsheet row definition    Current Medications (verified) Outpatient Encounter Medications as of 10/12/2023  Medication Sig   acetaminophen  (TYLENOL ) 500 MG tablet Take 500 mg by mouth every 6 (six) hours as needed.   calcium elemental as carbonate (TUMS ULTRA 1000) 400 MG chewable tablet Chew 1,000 mg by mouth daily.   Cholecalciferol (VITAMIN D3) 50 MCG (2000 UT) capsule Take 2,000 Units by mouth daily.   fluticasone  (FLONASE ) 50 MCG/ACT nasal spray Place 2 sprays into both nostrils daily.   Multiple Vitamins-Minerals (MULTIVITAMIN GUMMIES WOMENS) CHEW Chew 2 tablets by mouth daily.   Multiple Minerals-Vitamins (CALCIUM-MAGNESIUM-ZINC-D3 PO) Take 2 tablets by mouth daily.   No facility-administered encounter medications on file as of 10/12/2023.    Allergies (verified) Sulfonamide derivatives   History: Past Medical History:  Diagnosis Date   Allergic rhinitis    Allergy    Sulfa   Anemia 2005   Resolved following hysterectomy   Arthritis 2009   Neck, right big toe joint, fingers, hips   Cataract 2018   surgery 08/19/19 and 09/02/19   Heart murmur Birth   Resolved at 3 years  Past Surgical History:  Procedure Laterality Date   ABDOMINAL HYSTERECTOMY  2005   secondary to fibroid tumors and  significant bleeding/anemia   BREAST LUMPECTOMY Left    fibroadenoma L breast   EYE SURGERY Bilateral 09/02/2019, 08/18/2019   cataract sx Dr.Steven Allison Ivory      MYRINGOTOMY     Left   TOTAL ABDOMINAL HYSTERECTOMY W/ BILATERAL SALPINGOOPHORECTOMY     Family History  Problem Relation Age of Onset   Atrial fibrillation Mother    Breast cancer Mother 107   Thyroid  disease Mother    Cancer Mother 45       prostate   Heart disease Mother        a fib   Hypertension  Mother    Varicose Veins Mother    Prostate cancer Father    Hypertension Father    Cancer Father 20       prostate   Cancer Sister 35       neuroendocrine tumor   Colon polyps Sister    Thyroid  disease Sister    Thyroid  disease Brother    Arthritis Brother    Osteoarthritis Other 37   Colon cancer Neg Hx    Esophageal cancer Neg Hx    Rectal cancer Neg Hx    Stomach cancer Neg Hx    Social History   Socioeconomic History   Marital status: Married    Spouse name: Not on file   Number of children: Not on file   Years of education: Not on file   Highest education level: Not on file  Occupational History   Occupation: [employer]    Employer: [employer]    Comment: PT   Occupation: retired    Comment: 2018  Tobacco Use   Smoking status: Never   Smokeless tobacco: Never  Vaping Use   Vaping status: Never Used  Substance and Sexual Activity   Alcohol use: Yes    Alcohol/week: 3.0 standard drinks of alcohol    Types: 3 Glasses of wine per week   Drug use: No   Sexual activity: Not Currently    Partners: Male    Birth control/protection: Post-menopausal  Other Topics Concern   Not on file  Social History Narrative   Exercise-- walk almost everyday, water aerobics    Social Drivers of Health   Financial Resource Strain: Low Risk  (10/12/2023)   Overall Financial Resource Strain (CARDIA)    Difficulty of Paying Living Expenses: Not very hard  Food Insecurity: No Food Insecurity (10/12/2023)   Hunger Vital Sign    Worried About Running Out of Food in the Last Year: Never true    Ran Out of Food in the Last Year: Never true  Transportation Needs: No Transportation Needs (10/12/2023)   PRAPARE - Administrator, Civil Service (Medical): No    Lack of Transportation (Non-Medical): No  Physical Activity: Insufficiently Active (10/12/2023)   Exercise Vital Sign    Days of Exercise per Week: 3 days    Minutes of Exercise per Session: 40  min  Stress: No Stress Concern Present (10/12/2023)   [employer] of Occupational Health - Occupational Stress Questionnaire    Feeling of Stress: Not at all  Social Connections: Socially Integrated (10/12/2023)   Social Connection and Isolation Panel    Frequency of Communication with Friends and Family: More than three times a week    Frequency of Social Gatherings with Friends and Family: More than three times  a week    Attends Religious Services: More than 4 times per year    Active Member of Clubs or Organizations: Yes    Attends Engineer, structural: More than 4 times per year    Marital Status: Married    Tobacco Counseling Counseling given: Not Answered    Clinical Intake:  Pre-visit preparation completed: Yes  Pain : No/denies pain     BMI - recorded: 27.34 Nutritional Status: BMI 25 -29 Overweight Nutritional Risks: None Diabetes: No  No results found for: HGBA1C   How often do you need to have someone help you when you read instructions, pamphlets, or other written materials from your doctor or pharmacy?: 1 - Never What is the last grade level you completed in school?: Bachelor of Science  Interpreter Needed?: No  Information entered by :: Susa Engman, CMA   Activities of Daily Living     10/12/2023   10:00 AM  In your present state of health, do you have any difficulty performing the following activities:  Hearing? 0  Vision? 0  Difficulty concentrating or making decisions? 0  Walking or climbing stairs? 0  Dressing or bathing? 0  Doing errands, shopping? 0  Preparing Food and eating ? N  Using the Toilet? N  In the past six months, have you accidently leaked urine? Y  Comment when laughs or sneezes. Does exercises for it.  Do you have problems with loss of bowel control? N  Managing your Medications? N  Managing your Finances? N  Housekeeping or managing your Housekeeping? N    Patient Care Team: Crecencio Dodge, Candida Chalk, DO  as PCP - General Regal, Angus Kenning, DPM as Consulting Physician (Podiatry) Jewell, Jolene R, MD as Consulting Physician (Dermatology) Aminta Kales, MD as Consulting Physician (Ophthalmology)  I have updated your Care Teams any recent Medical Services you may have received from other providers in the past year.     Assessment:   This is a routine wellness examination for [facility].  Hearing/Vision screen Hearing Screening - Comments:: Denies hearing difficulties.  Vision Screening - Comments:: Wears reading glasses and up to date with eye exams. [facility]   Goals Addressed             This Visit's Progress    maintain healthy active lifestyle.   On track      Depression Screen     10/12/2023   10:08 AM 10/06/2022   11:09 AM 04/10/2022    9:54 AM 10/03/2021    2:33 PM 09/27/2020   10:27 AM 09/19/2019   10:15 AM 03/06/2019    8:46 AM  PHQ 2/9 Scores  PHQ - 2 Score 0 0 0 0 0 0 0  PHQ- 9 Score 1          Fall Risk     10/12/2023    9:54 AM 10/06/2022   11:06 AM 04/10/2022    9:54 AM 10/03/2021    2:32 PM 09/27/2020   10:26 AM  Fall Risk   Falls in the past year? 0 0 0 0 0  Number falls in past yr: 0 0 0 0 0  Injury with Fall? 0 0 0 0 0  Risk for fall due to : No Fall Risks No Fall Risks No Fall Risks No Fall Risks   Follow up Education provided Falls evaluation completed Falls evaluation completed  Falls evaluation completed  Falls prevention discussed      Data saved with a  previous flowsheet row definition    MEDICARE RISK AT HOME:  Medicare Risk at Home Any stairs in or around the home?: Yes (2 steps inside) If so, are there any without handrails?: Yes Home free of loose throw rugs in walkways, pet beds, electrical cords, etc?: No Adequate lighting in your home to reduce risk of falls?: Yes Life alert?: No Use of a cane, walker or w/c?: No Grab bars in the bathroom?: Yes Shower chair or bench in shower?: Yes Elevated toilet seat or a handicapped  toilet?: No  TIMED UP AND GO:  Was the test performed?  Yes  Length of time to ambulate 10 feet: 5 sec Gait steady and fast without use of assistive device  Cognitive Function: 6CIT completed    02/01/2017    3:51 PM  MMSE - Mini Mental State Exam  Orientation to time 5   Orientation to Place 5   Registration 3   Attention/ Calculation 5   Recall 3   Language- name 2 objects 2   Language- repeat 1  Language- follow 3 step command 3   Language- read & follow direction 1   Write a sentence 1   Copy design 1   Total score 30      Data saved with a previous flowsheet row definition        10/12/2023   10:12 AM 10/06/2022   11:12 AM 10/03/2021    2:36 PM  6CIT Screen  What Year? 0 points 0 points 0 points  What month? 0 points 0 points 0 points  What time? 0 points 0 points 0 points  Count back from 20 0 points 0 points 0 points  Months in reverse 0 points 0 points 0 points  Repeat phrase 0 points 0 points 0 points  Total Score 0 points 0 points 0 points    Immunizations Immunization History  Administered Date(s) Administered   Influenza Whole 01/26/2009, 02/19/2012   Influenza, High Dose Seasonal PF 01/15/2019   Influenza,inj,Quad PF,6+ Mos 02/18/2013, 01/30/2014, 02/01/2017   Influenza-Unspecified 01/23/2016, 02/12/2018, 01/26/2020, 01/17/2021, 02/10/2022, 01/16/2023   Moderna Covid-19 Vaccine Bivalent Booster 84yrs & up 01/17/2021, 02/27/2022   PFIZER(Purple Top)SARS-COV-2 Vaccination 06/07/2019, 06/30/2019, 01/29/2020   Pfizer(Comirnaty)Fall Seasonal Vaccine 12 years and older 01/16/2023   Pneumococcal Conjugate-13 02/01/2017   Pneumococcal Polysaccharide-23 02/26/2018   Td 11/22/2007   Tdap 11/22/2007, 02/27/2018   Zoster Recombinant(Shingrix) 12/21/2020, 03/03/2021   Zoster, Live 03/23/2014    Screening Tests Health Maintenance  Topic Date Due   COVID-19 Vaccine (7 - 2024-25 season) 07/16/2023   Medicare Annual Wellness (AWV)  10/06/2023   INFLUENZA  VACCINE  11/23/2023   MAMMOGRAM  01/12/2024   Colonoscopy  01/24/2028   DTaP/Tdap/Td (4 - Td or Tdap) 02/28/2028   Pneumococcal Vaccine: 50+ Years  Completed   DEXA SCAN  Completed   Hepatitis C Screening  Completed   Zoster Vaccines- Shingrix  Completed   HPV VACCINES  Aged Out   Meningococcal B Vaccine  Aged Out    Health Maintenance  Health Maintenance Due  Topic Date Due   COVID-19 Vaccine (7 - 2024-25 season) 07/16/2023   Medicare Annual Wellness (AWV)  10/06/2023   Health Maintenance Items Addressed: HM up to date.  Additional Screening:  Vision Screening: Recommended annual ophthalmology exams for early detection of glaucoma and other disorders of the eye. Would you like a referral to an eye doctor? No    Dental Screening: Recommended annual dental exams for proper oral hygiene  Community Resource Referral / Chronic Care Management: CRR required this visit?  No   CCM required this visit?  No   Plan:    I have personally reviewed and noted the following in the patient's chart:   Medical and social history Use of alcohol, tobacco or illicit drugs  Current medications and supplements including opioid prescriptions. Patient is not currently taking opioid prescriptions. Functional ability and status Nutritional status Physical activity Advanced directives List of other physicians Hospitalizations, surgeries, and ER visits in previous 12 months Vitals Screenings to include cognitive, depression, and falls Referrals and appointments  In addition, I have reviewed and discussed with patient certain preventive protocols, quality metrics, and best practice recommendations. A written personalized care plan for preventive services as well as general preventive health recommendations were provided to patient.   Susa Engman, CMA   10/12/2023   After Visit Summary: (In Person-Printed) AVS printed and given to the patient  Notes: Please see phone note.

## 2023-10-12 NOTE — Patient Instructions (Signed)
 Laura Price , Thank you for taking time out of your busy schedule to complete your Annual Wellness Visit with me. I enjoyed our conversation and look forward to speaking with you again next year. I, as well as your care team,  appreciate your ongoing commitment to your health goals. Please review the following plan we discussed and let me know if I can assist you in the future. Your Game plan/ To Do List     Follow up Visits: Next Medicare AWV with our clinical staff: 10/17/24 9:40am   Next Office Visit with your provider: 04/29/24 9am  Clinician Recommendations:  Aim for 30 minutes of exercise or brisk walking, 6-8 glasses of water, and 5 servings of fruits and vegetables each day.       This is a list of the screening recommended for you and due dates:  Health Maintenance  Topic Date Due   COVID-19 Vaccine (7 - 2024-25 season) 07/16/2023   Medicare Annual Wellness Visit  10/06/2023   Flu Shot  11/23/2023   Mammogram  01/12/2024   Colon Cancer Screening  01/24/2028   DTaP/Tdap/Td vaccine (4 - Td or Tdap) 02/28/2028   Pneumococcal Vaccine for age over 40  Completed   DEXA scan (bone density measurement)  Completed   Hepatitis C Screening  Completed   Zoster (Shingles) Vaccine  Completed   HPV Vaccine  Aged Out   Meningitis B Vaccine  Aged Out    See attachments for Fall Prevention Tips.

## 2023-10-25 NOTE — Telephone Encounter (Signed)
 Pt states she is already on DASH diet and her sodium level is usually low on her lab work. Pt wonders if she should try more exercise and weight loss? Please advise if any new recommendation?

## 2023-12-31 ENCOUNTER — Other Ambulatory Visit: Payer: Self-pay | Admitting: Family Medicine

## 2023-12-31 DIAGNOSIS — Z1231 Encounter for screening mammogram for malignant neoplasm of breast: Secondary | ICD-10-CM

## 2024-01-15 ENCOUNTER — Ambulatory Visit
Admission: RE | Admit: 2024-01-15 | Discharge: 2024-01-15 | Disposition: A | Discharge status: Home / Self Care | Source: Ambulatory Visit | Attending: Family Medicine | Admitting: Family Medicine

## 2024-01-15 DIAGNOSIS — Z1231 Encounter for screening mammogram for malignant neoplasm of breast: Secondary | ICD-10-CM

## 2024-01-25 ENCOUNTER — Encounter: Payer: Self-pay | Admitting: Family Medicine

## 2024-01-25 ENCOUNTER — Ambulatory Visit: Admitting: Family Medicine

## 2024-01-25 VITALS — BP 122/62 | HR 61 | Temp 97.8°F | Resp 18 | Ht 64.5 in | Wt 163.2 lb

## 2024-01-25 DIAGNOSIS — R42 Dizziness and giddiness: Secondary | ICD-10-CM

## 2024-01-25 DIAGNOSIS — I10 Essential (primary) hypertension: Secondary | ICD-10-CM

## 2024-01-25 LAB — CBC WITH DIFFERENTIAL/PLATELET
Basophils Absolute: 0.1 K/uL (ref 0.0–0.1)
Basophils Relative: 1.2 % (ref 0.0–3.0)
Eosinophils Absolute: 0.1 K/uL (ref 0.0–0.7)
Eosinophils Relative: 1.1 % (ref 0.0–5.0)
HCT: 41 % (ref 36.0–46.0)
Hemoglobin: 13.6 g/dL (ref 12.0–15.0)
Lymphocytes Relative: 20.8 % (ref 12.0–46.0)
Lymphs Abs: 1 K/uL (ref 0.7–4.0)
MCHC: 33.2 g/dL (ref 30.0–36.0)
MCV: 87.3 fl (ref 78.0–100.0)
Monocytes Absolute: 0.5 K/uL (ref 0.1–1.0)
Monocytes Relative: 10.9 % (ref 3.0–12.0)
Neutro Abs: 3.1 K/uL (ref 1.4–7.7)
Neutrophils Relative %: 66 % (ref 43.0–77.0)
Platelets: 240 K/uL (ref 150.0–400.0)
RBC: 4.7 Mil/uL (ref 3.87–5.11)
RDW: 14.1 % (ref 11.5–15.5)
WBC: 4.8 K/uL (ref 4.0–10.5)

## 2024-01-25 LAB — LIPID PANEL
Cholesterol: 165 mg/dL (ref 0–200)
HDL: 73.8 mg/dL (ref >39.00)
LDL Cholesterol: 76 mg/dL (ref 0–99)
NonHDL: 91.39
Total CHOL/HDL Ratio: 2
Triglycerides: 76 mg/dL (ref 0.0–149.0)
VLDL: 15.2 mg/dL (ref 0.0–40.0)

## 2024-01-25 LAB — COMPREHENSIVE METABOLIC PANEL WITH GFR
ALT: 15 U/L (ref 0–35)
AST: 18 U/L (ref 0–37)
Albumin: 4.3 g/dL (ref 3.5–5.2)
Alkaline Phosphatase: 98 U/L (ref 39–117)
BUN: 17 mg/dL (ref 6–23)
CO2: 30 meq/L (ref 19–32)
Calcium: 9.2 mg/dL (ref 8.4–10.5)
Chloride: 97 meq/L (ref 96–112)
Creatinine, Ser: 0.55 mg/dL (ref 0.40–1.20)
GFR: 91.65 mL/min (ref >60.00)
Glucose, Bld: 99 mg/dL (ref 70–99)
Potassium: 4.4 meq/L (ref 3.5–5.1)
Sodium: 134 meq/L — ABNORMAL LOW (ref 135–145)
Total Bilirubin: 0.4 mg/dL (ref 0.2–1.2)
Total Protein: 6.7 g/dL (ref 6.0–8.3)

## 2024-01-25 LAB — TSH: TSH: 1.71 u[IU]/mL (ref 0.35–5.50)

## 2024-01-25 MED ORDER — LOSARTAN POTASSIUM 25 MG PO TABS: 25.0000 mg | ORAL_TABLET | Freq: Every day | ORAL | 1 refills | Status: AC

## 2024-01-25 NOTE — Assessment & Plan Note (Signed)
 Poorly controlled will alter medications, encouraged DASH diet, minimize caffeine and obtain adequate sleep. Report concerning symptoms and follow up as directed and as needed

## 2024-01-25 NOTE — Patient Instructions (Signed)

## 2024-01-25 NOTE — Progress Notes (Signed)
 Subjective:    Patient ID: Laura Price, female    DOB: December 10, 1950, 73 y.o.   MRN: [id number]  Chief Complaint  Patient presents with   Hypertension   Follow-up    HPI Patient is in today for f/u bp. Discussed the use of AI scribe software for clinical note transcription with the patient, who gave verbal consent to proceed.  History of Present Illness Laura Price is a 73 year old female with hypertension who presents for a wellness visit and blood pressure management.  She has experienced variable blood pressure readings, with some being high and others low. At home, her blood pressure was higher compared to the clinic, with a recent reading of 137/unknown at home and 122/62 in the clinic. She uses a battery-operated blood pressure monitor that has not had its batteries changed recently.  She had COVID-19 in July, during which her blood pressure was lower despite taking ibuprofen and cough medicine. She follows a DASH diet due to her husband's hypertension and uses low salt in her meals.  She has experienced dizziness on two occasions recently, which she attributes to her neck position. These episodes occurred while she was at [facility] and while crocheting, resolving quickly when she adjusted her head position. She has arthritis in her neck, which she believes may contribute to her dizziness.  No headaches, chest pain, or swelling in her ankles. Her family history is significant for hypertension, as both her parents had it.  She lives in [place] and enjoys walking to the park to listen to music. She uses [facility] for her medications.    Past Medical History:  Diagnosis Date   Allergic rhinitis    Allergy    Sulfa   Anemia 2005   Resolved following hysterectomy   Arthritis 2009   Neck, right big toe joint, fingers, hips   Cataract 2018   surgery 08/19/19 and 09/02/19   Heart murmur Birth   Resolved at 3 years    Past Surgical History:  Procedure  Laterality Date   ABDOMINAL HYSTERECTOMY  2005   secondary to fibroid tumors and  significant bleeding/anemia   BREAST LUMPECTOMY Left    fibroadenoma L breast   EYE SURGERY Bilateral 09/02/2019, 08/18/2019   cataract sx Dr.Steven Marcey      MYRINGOTOMY     Left   TOTAL ABDOMINAL HYSTERECTOMY W/ BILATERAL SALPINGOOPHORECTOMY      Family History  Problem Relation Age of Onset   Atrial fibrillation Mother    Breast cancer Mother 57   Thyroid  disease Mother    Cancer Mother 56       prostate   Heart disease Mother        a fib   Hypertension Mother    Varicose Veins Mother    Prostate cancer Father    Hypertension Father    Cancer Father 77       prostate   Cancer Sister 36       neuroendocrine tumor   Colon polyps Sister    Thyroid  disease Sister    Thyroid  disease Brother    Arthritis Brother    Osteoarthritis Other 21   Colon cancer Neg Hx    Esophageal cancer Neg Hx    Rectal cancer Neg Hx    Stomach cancer Neg Hx     Social History   Socioeconomic History   Marital status: Married    Spouse name: Not on file   Number of children: Not on file  Years of education: Not on file   Highest education level: Bachelor's degree (e.g., BA, AB, BS)  Occupational History   Occupation: [employer]    Employer: [employer]    Comment: PT   Occupation: retired    Comment: 2018  Tobacco Use   Smoking status: Never   Smokeless tobacco: Never  Vaping Use   Vaping status: Never Used  Substance and Sexual Activity   Alcohol use: Yes    Alcohol/week: 3.0 standard drinks of alcohol    Types: 3 Glasses of wine per week   Drug use: No   Sexual activity: Not Currently    Partners: Male    Birth control/protection: Post-menopausal  Other Topics Concern   Not on file  Social History Narrative   Exercise-- walk almost everyday, water aerobics    Social Drivers of Health   Financial Resource Strain: Low Risk  (01/18/2024)   Overall Financial  Resource Strain (CARDIA)    Difficulty of Paying Living Expenses: Not hard at all  Food Insecurity: No Food Insecurity (01/18/2024)   Hunger Vital Sign    Worried About Running Out of Food in the Last Year: Never true    Ran Out of Food in the Last Year: Never true  Transportation Needs: No Transportation Needs (01/18/2024)   PRAPARE - Administrator, Civil Service (Medical): No    Lack of Transportation (Non-Medical): No  Physical Activity: Sufficiently Active (01/18/2024)   Exercise Vital Sign    Days of Exercise per Week: 5 days    Minutes of Exercise per Session: 60 min  Stress: No Stress Concern Present (01/18/2024)   [employer] of Occupational Health - Occupational Stress Questionnaire    Feeling of Stress: Not at all  Social Connections: Socially Integrated (01/18/2024)   Social Connection and Isolation Panel    Frequency of Communication with Friends and Family: More than three times a week    Frequency of Social Gatherings with Friends and Family: Three times a week    Attends Religious Services: More than 4 times per year    Active Member of Clubs or Organizations: Yes    Attends Banker Meetings: More than 4 times per year    Marital Status: Married  Catering manager Violence: Not At Risk (10/12/2023)   Humiliation, Afraid, Rape, and Kick questionnaire    Fear of Current or Ex-Partner: No    Emotionally Abused: No    Physically Abused: No    Sexually Abused: No    Outpatient Medications Prior to Visit  Medication Sig Dispense Refill   acetaminophen  (TYLENOL ) 500 MG tablet Take 500 mg by mouth every 6 (six) hours as needed.     calcium elemental as carbonate (TUMS ULTRA 1000) 400 MG chewable tablet Chew 1,000 mg by mouth daily.     Cholecalciferol (VITAMIN D3) 50 MCG (2000 UT) capsule Take 2,000 Units by mouth daily.     fluticasone  (FLONASE ) 50 MCG/ACT nasal spray Place 2 sprays into both nostrils daily.     Multiple Minerals-Vitamins  (CALCIUM-MAGNESIUM-ZINC-D3 PO) Take 2 tablets by mouth daily.     Multiple Vitamins-Minerals (MULTIVITAMIN GUMMIES WOMENS) CHEW Chew 2 tablets by mouth daily.     No facility-administered medications prior to visit.    Allergies  Allergen Reactions   Sulfonamide Derivatives Swelling and Rash    Review of Systems  Constitutional:  Negative for fever and malaise/fatigue.  HENT:  Negative for congestion.   Eyes:  Negative for  blurred vision.  Respiratory:  Negative for cough and shortness of breath.   Cardiovascular:  Negative for chest pain, palpitations and leg swelling.  Gastrointestinal:  Negative for vomiting.  Musculoskeletal:  Negative for back pain.  Skin:  Negative for rash.  Neurological:  Negative for loss of consciousness and headaches.       Objective:    Physical Exam Vitals and nursing note reviewed.  Constitutional:      General: She is not in acute distress.    Appearance: Normal appearance. She is well-developed.  HENT:     Head: Normocephalic and atraumatic.  Eyes:     General: No scleral icterus.       Right eye: No discharge.        Left eye: No discharge.  Neck:     Vascular: No carotid bruit.  Cardiovascular:     Rate and Rhythm: Normal rate and regular rhythm.     Heart sounds: No murmur heard. Pulmonary:     Effort: Pulmonary effort is normal. No respiratory distress.     Breath sounds: Normal breath sounds.  Musculoskeletal:        General: Normal range of motion.     Cervical back: Normal range of motion and neck supple. No rigidity or tenderness.     Right lower leg: No edema.     Left lower leg: No edema.  Lymphadenopathy:     Cervical: No cervical adenopathy.  Skin:    General: Skin is warm and dry.  Neurological:     Mental Status: She is alert and oriented to person, place, and time.  Psychiatric:        Mood and Affect: Mood normal.        Behavior: Behavior normal.        Thought Content: Thought content normal.         Judgment: Judgment normal.     BP 122/62 (BP Location: Left Arm, Patient Position: Sitting, Cuff Size: Normal)   Pulse 61   Temp 97.8 F (36.6 C) (Oral)   Resp 18   Ht 5' 4.5 (1.638 m)   Wt 163 lb 3.2 oz (74 kg)   SpO2 98%   BMI 27.58 kg/m  Wt Readings from Last 3 Encounters:  01/25/24 163 lb 3.2 oz (74 kg)  10/12/23 161 lb 12.8 oz (73.4 kg)  04/13/23 160 lb 12.8 oz (72.9 kg)    Diabetic Foot Exam - Simple   No data filed    Lab Results  Component Value Date   WBC 5.1 04/13/2023   HGB 13.8 04/13/2023   HCT 41.6 04/13/2023   PLT 260.0 04/13/2023   GLUCOSE 99 04/13/2023   CHOL 172 04/13/2023   TRIG 46.0 04/13/2023   HDL 77.70 04/13/2023   LDLCALC 85 04/13/2023   ALT 16 04/13/2023   AST 19 04/13/2023   NA 133 (L) 04/13/2023   K 4.5 04/13/2023   CL 98 04/13/2023   CREATININE 0.56 04/13/2023   BUN 13 04/13/2023   CO2 27 04/13/2023   TSH 1.39 04/10/2022   MICROALBUR 0.4 03/23/2014    Lab Results  Component Value Date   TSH 1.39 04/10/2022   Lab Results  Component Value Date   WBC 5.1 04/13/2023   HGB 13.8 04/13/2023   HCT 41.6 04/13/2023   MCV 88.5 04/13/2023   PLT 260.0 04/13/2023   Lab Results  Component Value Date   NA 133 (L) 04/13/2023   K 4.5 04/13/2023   CO2 27  04/13/2023   GLUCOSE 99 04/13/2023   BUN 13 04/13/2023   CREATININE 0.56 04/13/2023   BILITOT 0.6 04/13/2023   ALKPHOS 97 04/13/2023   AST 19 04/13/2023   ALT 16 04/13/2023   PROT 6.5 04/13/2023   ALBUMIN 4.2 04/13/2023   CALCIUM 8.9 04/13/2023   GFR 91.76 04/13/2023   Lab Results  Component Value Date   CHOL 172 04/13/2023   Lab Results  Component Value Date   HDL 77.70 04/13/2023   Lab Results  Component Value Date   LDLCALC 85 04/13/2023   Lab Results  Component Value Date   TRIG 46.0 04/13/2023   Lab Results  Component Value Date   CHOLHDL 2 04/13/2023   No results found for: HGBA1C EKG---  nsr, no change from previous EKG     Assessment & Plan:   Primary hypertension Assessment & Plan: Poorly controlled will alter medications, encouraged DASH diet, minimize caffeine and obtain adequate sleep. Report concerning symptoms and follow up as directed and as needed   Orders: -     CBC with Differential/Platelet -     Comprehensive metabolic panel with GFR -     Lipid panel -     TSH -     Losartan Potassium; Take 1 tablet (25 mg total) by mouth daily.  Dispense: 90 tablet; Refill: 1 -     EKG 12-Lead  Vertigo -     US  Carotid Bilateral; Future -     EKG 12-Lead   Assessment and Plan Assessment & Plan Adult Wellness Visit   Routine wellness visit with optimal blood pressure reading of 122/62 mmHg.  Hypertension   Blood pressure readings are variable, with current office readings of 149/65 mmHg and 158/84 mmHg. Hypertension is likely genetic. She follows the DASH diet and exercises. Discussed risks of untreated hypertension, including stroke. Prescribe losartan 25 mg once daily. Instruct to monitor blood pressure at home and call if experiencing dizziness or other side effects. Schedule follow-up appointment in 2-3 weeks to recheck blood pressure.  Positional Dizziness   Episodes of dizziness are associated with neck position, possibly related to cervical spondylosis. No headaches or chest pain. Differential includes cervical spondylosis affecting circulation or carotid artery issues. Order carotid ultrasound to assess circulation and perform EKG to rule out cardiac causes of dizziness. Advise to report if dizziness becomes more frequent or unrelated to neck position.  Cervical Spondylosis   Cervical spondylosis may contribute to positional dizziness. No new symptoms reported.   Munachimso Palin R Lowne Chase, DO

## 2024-01-31 ENCOUNTER — Ambulatory Visit (HOSPITAL_BASED_OUTPATIENT_CLINIC_OR_DEPARTMENT_OTHER)
Admission: RE | Admit: 2024-01-31 | Discharge: 2024-01-31 | Disposition: A | Discharge status: Home / Self Care | Source: Ambulatory Visit | Attending: Family Medicine | Admitting: Family Medicine

## 2024-01-31 DIAGNOSIS — R42 Dizziness and giddiness: Secondary | ICD-10-CM | POA: Insufficient documentation

## 2024-01-31 DIAGNOSIS — I6523 Occlusion and stenosis of bilateral carotid arteries: Secondary | ICD-10-CM | POA: Diagnosis not present

## 2024-02-02 ENCOUNTER — Ambulatory Visit: Payer: Self-pay | Admitting: Family Medicine

## 2024-02-19 ENCOUNTER — Ambulatory Visit (HOSPITAL_BASED_OUTPATIENT_CLINIC_OR_DEPARTMENT_OTHER)
Admission: RE | Admit: 2024-02-19 | Discharge: 2024-02-19 | Disposition: A | Discharge status: Home / Self Care | Source: Ambulatory Visit | Attending: Family Medicine | Admitting: Family Medicine

## 2024-02-19 ENCOUNTER — Ambulatory Visit: Admitting: Family Medicine

## 2024-02-19 ENCOUNTER — Encounter: Payer: Self-pay | Admitting: Family Medicine

## 2024-02-19 VITALS — BP 112/80 | HR 67 | Temp 97.8°F | Resp 18 | Ht 64.5 in | Wt 161.8 lb

## 2024-02-19 DIAGNOSIS — M542 Cervicalgia: Secondary | ICD-10-CM

## 2024-02-19 DIAGNOSIS — I10 Essential (primary) hypertension: Secondary | ICD-10-CM

## 2024-02-19 DIAGNOSIS — M4312 Spondylolisthesis, cervical region: Secondary | ICD-10-CM | POA: Diagnosis not present

## 2024-02-19 DIAGNOSIS — M4802 Spinal stenosis, cervical region: Secondary | ICD-10-CM | POA: Diagnosis not present

## 2024-02-19 DIAGNOSIS — M47812 Spondylosis without myelopathy or radiculopathy, cervical region: Secondary | ICD-10-CM | POA: Diagnosis not present

## 2024-02-19 DIAGNOSIS — R42 Dizziness and giddiness: Secondary | ICD-10-CM

## 2024-02-19 NOTE — Progress Notes (Signed)
 Subjective:    Patient ID: Laura Price, female    DOB: 03-23-1951, 73 y.o.   MRN: [id number]  Chief Complaint  Patient presents with   Hypertension   Follow-up    HPI Patient is in today for f/u bp.  Discussed the use of AI scribe software for clinical note transcription with the patient, who gave verbal consent to proceed.  History of Present Illness Laura Price is a 73 year old female with hypertension and neck arthritis who presents for blood pressure management and neck pain.  She has been monitoring her blood pressure at home and started taking losartan 25 mg. Initially, her blood pressure readings were good, but recently they have been slightly higher, around 130s to 140s. She checks her blood pressure in the morning before breakfast after sitting for about ten minutes. She has a 90-day supply of losartan and is currently taking it after breakfast with her vitamins.  She has a history of vertigo, which she attributes to arthritis in her neck. She experiences dizziness when her neck is extended or flat for a period of time. She has a limited range of motion in her neck, which has been a long-standing issue. X-rays taken several years ago showed arthritic changes. She manages her symptoms with exercises to maintain range of motion and uses a supportive pillow. Despite these measures, she experiences pain when turning her head, particularly when driving.  She underwent an ultrasound and previous labs, which were normal. Dizziness is associated with neck position.    Past Medical History:  Diagnosis Date   Allergic rhinitis    Allergy    Sulfa   Anemia 2005   Resolved following hysterectomy   Arthritis 2009   Neck, right big toe joint, fingers, hips   Cataract 2018   surgery 08/19/19 and 09/02/19   Heart murmur Birth   Resolved at 3 years    Past Surgical History:  Procedure Laterality Date   ABDOMINAL HYSTERECTOMY  2005   secondary to fibroid  tumors and  significant bleeding/anemia   BREAST LUMPECTOMY Left    fibroadenoma L breast   EYE SURGERY Bilateral 09/02/2019, 08/18/2019   cataract sx Dr.Steven Marcey      MYRINGOTOMY     Left   TOTAL ABDOMINAL HYSTERECTOMY W/ BILATERAL SALPINGOOPHORECTOMY      Family History  Problem Relation Age of Onset   Atrial fibrillation Mother    Breast cancer Mother 59   Thyroid  disease Mother    Cancer Mother 55       prostate   Heart disease Mother        a fib   Hypertension Mother    Varicose Veins Mother    Prostate cancer Father    Hypertension Father    Cancer Father 91       prostate   Cancer Sister 79       neuroendocrine tumor   Colon polyps Sister    Thyroid  disease Sister    Thyroid  disease Brother    Arthritis Brother    Osteoarthritis Other 67   Colon cancer Neg Hx    Esophageal cancer Neg Hx    Rectal cancer Neg Hx    Stomach cancer Neg Hx     Social History   Socioeconomic History   Marital status: Married    Spouse name: Not on file   Number of children: Not on file   Years of education: Not on file   Highest education level: Bachelor's  degree (e.g., BA, AB, BS)  Occupational History   Occupation: [employer]    Employer: [employer]    Comment: PT   Occupation: retired    Comment: 2018  Tobacco Use   Smoking status: Never   Smokeless tobacco: Never  Vaping Use   Vaping status: Never Used  Substance and Sexual Activity   Alcohol use: Yes    Alcohol/week: 3.0 standard drinks of alcohol    Types: 3 Glasses of wine per week   Drug use: No   Sexual activity: Not Currently    Partners: Male    Birth control/protection: Post-menopausal  Other Topics Concern   Not on file  Social History Narrative   Exercise-- walk almost everyday, water aerobics    Social Drivers of Health   Financial Resource Strain: Low Risk  (02/18/2024)   Overall Financial Resource Strain (CARDIA)    Difficulty of Paying Living Expenses: Not hard  at all  Food Insecurity: No Food Insecurity (02/18/2024)   Hunger Vital Sign    Worried About Running Out of Food in the Last Year: Never true    Ran Out of Food in the Last Year: Never true  Transportation Needs: No Transportation Needs (02/18/2024)   PRAPARE - Administrator, Civil Service (Medical): No    Lack of Transportation (Non-Medical): No  Physical Activity: Sufficiently Active (02/18/2024)   Exercise Vital Sign    Days of Exercise per Week: 5 days    Minutes of Exercise per Session: 30 min  Stress: No Stress Concern Present (02/18/2024)   [employer] of Occupational Health - Occupational Stress Questionnaire    Feeling of Stress: Not at all  Social Connections: Socially Integrated (02/18/2024)   Social Connection and Isolation Panel    Frequency of Communication with Friends and Family: More than three times a week    Frequency of Social Gatherings with Friends and Family: Three times a week    Attends Religious Services: More than 4 times per year    Active Member of Clubs or Organizations: Yes    Attends Banker Meetings: More than 4 times per year    Marital Status: Married  Catering Manager Violence: Not At Risk (10/12/2023)   Humiliation, Afraid, Rape, and Kick questionnaire    Fear of Current or Ex-Partner: No    Emotionally Abused: No    Physically Abused: No    Sexually Abused: No    Outpatient Medications Prior to Visit  Medication Sig Dispense Refill   acetaminophen  (TYLENOL ) 500 MG tablet Take 500 mg by mouth every 6 (six) hours as needed.     calcium elemental as carbonate (TUMS ULTRA 1000) 400 MG chewable tablet Chew 1,000 mg by mouth daily.     Cholecalciferol (VITAMIN D3) 50 MCG (2000 UT) capsule Take 2,000 Units by mouth daily.     fluticasone  (FLONASE ) 50 MCG/ACT nasal spray Place 2 sprays into both nostrils daily.     losartan (COZAAR) 25 MG tablet Take 1 tablet (25 mg total) by mouth daily. 90 tablet 1   Multiple  Minerals-Vitamins (CALCIUM-MAGNESIUM-ZINC-D3 PO) Take 2 tablets by mouth daily.     Multiple Vitamins-Minerals (MULTIVITAMIN GUMMIES WOMENS) CHEW Chew 2 tablets by mouth daily.     No facility-administered medications prior to visit.    Allergies  Allergen Reactions   Sulfonamide Derivatives Swelling and Rash    Review of Systems  Constitutional:  Negative for chills, fever and malaise/fatigue.  HENT:  Negative  for congestion and hearing loss.   Eyes:  Negative for blurred vision and discharge.  Respiratory:  Negative for cough, sputum production and shortness of breath.   Cardiovascular:  Negative for chest pain, palpitations and leg swelling.  Gastrointestinal:  Negative for abdominal pain, blood in stool, constipation, diarrhea, heartburn, nausea and vomiting.  Genitourinary:  Negative for dysuria, frequency, hematuria and urgency.  Musculoskeletal:  Negative for back pain, falls and myalgias.  Skin:  Negative for rash.  Neurological:  Negative for dizziness, sensory change, loss of consciousness, weakness and headaches.  Endo/Heme/Allergies:  Negative for environmental allergies. Does not bruise/bleed easily.  Psychiatric/Behavioral:  Negative for depression and suicidal ideas. The patient is not nervous/anxious and does not have insomnia.        Objective:    Physical Exam Vitals and nursing note reviewed.  Constitutional:      General: She is not in acute distress.    Appearance: Normal appearance. She is well-developed.  HENT:     Head: Normocephalic and atraumatic.  Eyes:     General: No scleral icterus.       Right eye: No discharge.        Left eye: No discharge.  Cardiovascular:     Rate and Rhythm: Normal rate and regular rhythm.     Heart sounds: No murmur heard. Pulmonary:     Effort: Pulmonary effort is normal. No respiratory distress.     Breath sounds: Normal breath sounds.  Musculoskeletal:        General: Normal range of motion.     Cervical back:  Normal range of motion and neck supple.     Right lower leg: No edema.     Left lower leg: No edema.  Skin:    General: Skin is warm and dry.  Neurological:     Mental Status: She is alert and oriented to person, place, and time.  Psychiatric:        Mood and Affect: Mood normal.        Behavior: Behavior normal.        Thought Content: Thought content normal.        Judgment: Judgment normal.     BP 112/80 (BP Location: Left Arm, Patient Position: Sitting, Cuff Size: Normal)   Pulse 67   Temp 97.8 F (36.6 C) (Oral)   Resp 18   Ht 5' 4.5 (1.638 m)   Wt 161 lb 12.8 oz (73.4 kg)   SpO2 97%   BMI 27.34 kg/m  Wt Readings from Last 3 Encounters:  02/19/24 161 lb 12.8 oz (73.4 kg)  01/25/24 163 lb 3.2 oz (74 kg)  10/12/23 161 lb 12.8 oz (73.4 kg)    Diabetic Foot Exam - Simple   No data filed    Lab Results  Component Value Date   WBC 4.8 01/25/2024   HGB 13.6 01/25/2024   HCT 41.0 01/25/2024   PLT 240.0 01/25/2024   GLUCOSE 99 01/25/2024   CHOL 165 01/25/2024   TRIG 76.0 01/25/2024   HDL 73.80 01/25/2024   LDLCALC 76 01/25/2024   ALT 15 01/25/2024   AST 18 01/25/2024   NA 134 (L) 01/25/2024   K 4.4 01/25/2024   CL 97 01/25/2024   CREATININE 0.55 01/25/2024   BUN 17 01/25/2024   CO2 30 01/25/2024   TSH 1.71 01/25/2024   MICROALBUR 0.4 03/23/2014    Lab Results  Component Value Date   TSH 1.71 01/25/2024   Lab Results  Component Value  Date   WBC 4.8 01/25/2024   HGB 13.6 01/25/2024   HCT 41.0 01/25/2024   MCV 87.3 01/25/2024   PLT 240.0 01/25/2024   Lab Results  Component Value Date   NA 134 (L) 01/25/2024   K 4.4 01/25/2024   CO2 30 01/25/2024   GLUCOSE 99 01/25/2024   BUN 17 01/25/2024   CREATININE 0.55 01/25/2024   BILITOT 0.4 01/25/2024   ALKPHOS 98 01/25/2024   AST 18 01/25/2024   ALT 15 01/25/2024   PROT 6.7 01/25/2024   ALBUMIN 4.3 01/25/2024   CALCIUM 9.2 01/25/2024   GFR 91.65 01/25/2024   Lab Results  Component Value Date    CHOL 165 01/25/2024   Lab Results  Component Value Date   HDL 73.80 01/25/2024   Lab Results  Component Value Date   LDLCALC 76 01/25/2024   Lab Results  Component Value Date   TRIG 76.0 01/25/2024   Lab Results  Component Value Date   CHOLHDL 2 01/25/2024   No results found for: HGBA1C     Assessment & Plan:  Neck pain -     DG Cervical Spine Complete; Future  Primary hypertension Assessment & Plan: Well controlled, no changes to meds. Encouraged heart healthy diet such as the DASH diet and exercise as tolerated.     Vertigo  Assessment and Plan Assessment & Plan Cervical spondylosis with associated vertigo   Cervical spondylosis presents with limited neck range of motion and occasional dizziness, possibly due to neck position or vertebral basilar insufficiency. Previous x-rays showed arthritic changes. Symptoms include neck pain and difficulty turning the head, especially while driving. She manages symptoms with exercises and pillow positioning. Order a cervical spine x-ray to assess for progression of arthritic changes.  Hypertension   Home blood pressure readings have been mostly good, but recent readings are slightly elevated. Current medication is losartan 25 mg. Office blood pressure was 130/60, slightly higher than previous readings but not concerning. Discussed increasing losartan to 50 mg if home readings consistently show 130s to 140s. She should monitor blood pressure at different times and record the readings. Increase losartan to 50 mg if home readings consistently show 130s to 140s. Notify the office if the dose is increased and schedule follow-up if blood pressure remains elevated.    Vladimir Lenhoff R Lowne Chase, DO

## 2024-02-19 NOTE — Assessment & Plan Note (Signed)
 Well controlled, no changes to meds. Encouraged heart healthy diet such as the DASH diet and exercise as tolerated.

## 2024-02-21 ENCOUNTER — Ambulatory Visit: Payer: Self-pay | Admitting: Family Medicine

## 2024-04-29 ENCOUNTER — Encounter: Payer: Self-pay | Admitting: Family Medicine

## 2024-04-29 ENCOUNTER — Other Ambulatory Visit (HOSPITAL_BASED_OUTPATIENT_CLINIC_OR_DEPARTMENT_OTHER): Payer: Self-pay | Admitting: Family Medicine

## 2024-04-29 ENCOUNTER — Ambulatory Visit (INDEPENDENT_AMBULATORY_CARE_PROVIDER_SITE_OTHER): Payer: PPO | Admitting: Family Medicine

## 2024-04-29 VITALS — BP 134/70 | HR 63 | Temp 98.0°F | Resp 16 | Ht 64.5 in | Wt 163.2 lb

## 2024-04-29 DIAGNOSIS — I10 Essential (primary) hypertension: Secondary | ICD-10-CM | POA: Diagnosis not present

## 2024-04-29 DIAGNOSIS — E2839 Other primary ovarian failure: Secondary | ICD-10-CM | POA: Diagnosis not present

## 2024-04-29 DIAGNOSIS — E559 Vitamin D deficiency, unspecified: Secondary | ICD-10-CM

## 2024-04-29 DIAGNOSIS — Z0000 Encounter for general adult medical examination without abnormal findings: Secondary | ICD-10-CM

## 2024-04-29 DIAGNOSIS — Z1231 Encounter for screening mammogram for malignant neoplasm of breast: Secondary | ICD-10-CM

## 2024-04-29 LAB — COMPREHENSIVE METABOLIC PANEL WITH GFR
ALT: 14 U/L (ref 3–35)
AST: 16 U/L (ref 5–37)
Albumin: 4.3 g/dL (ref 3.5–5.2)
Alkaline Phosphatase: 101 U/L (ref 39–117)
BUN: 17 mg/dL (ref 6–23)
CO2: 28 meq/L (ref 19–32)
Calcium: 9.1 mg/dL (ref 8.4–10.5)
Chloride: 97 meq/L (ref 96–112)
Creatinine, Ser: 0.54 mg/dL (ref 0.40–1.20)
GFR: 91.89 mL/min
Glucose, Bld: 98 mg/dL (ref 70–99)
Potassium: 4.7 meq/L (ref 3.5–5.1)
Sodium: 132 meq/L — ABNORMAL LOW (ref 135–145)
Total Bilirubin: 0.6 mg/dL (ref 0.2–1.2)
Total Protein: 6.8 g/dL (ref 6.0–8.3)

## 2024-04-29 LAB — CBC WITH DIFFERENTIAL/PLATELET
Basophils Absolute: 0.1 K/uL (ref 0.0–0.1)
Basophils Relative: 1.1 % (ref 0.0–3.0)
Eosinophils Absolute: 0.1 K/uL (ref 0.0–0.7)
Eosinophils Relative: 1.5 % (ref 0.0–5.0)
HCT: 40.9 % (ref 36.0–46.0)
Hemoglobin: 13.6 g/dL (ref 12.0–15.0)
Lymphocytes Relative: 18.7 % (ref 12.0–46.0)
Lymphs Abs: 1.1 K/uL (ref 0.7–4.0)
MCHC: 33.3 g/dL (ref 30.0–36.0)
MCV: 87.4 fl (ref 78.0–100.0)
Monocytes Absolute: 0.5 K/uL (ref 0.1–1.0)
Monocytes Relative: 8.2 % (ref 3.0–12.0)
Neutro Abs: 4.2 K/uL (ref 1.4–7.7)
Neutrophils Relative %: 70.5 % (ref 43.0–77.0)
Platelets: 264 K/uL (ref 150.0–400.0)
RBC: 4.67 Mil/uL (ref 3.87–5.11)
RDW: 14.2 % (ref 11.5–15.5)
WBC: 6 K/uL (ref 4.0–10.5)

## 2024-04-29 LAB — LIPID PANEL
Cholesterol: 189 mg/dL (ref 28–200)
HDL: 79.9 mg/dL
LDL Cholesterol: 99 mg/dL (ref 10–99)
NonHDL: 109
Total CHOL/HDL Ratio: 2
Triglycerides: 51 mg/dL (ref 10.0–149.0)
VLDL: 10.2 mg/dL (ref 0.0–40.0)

## 2024-04-29 LAB — TSH: TSH: 1.83 u[IU]/mL (ref 0.35–5.50)

## 2024-04-29 LAB — VITAMIN D 25 HYDROXY (VIT D DEFICIENCY, FRACTURES): VITD: 59.4 ng/mL (ref 30.00–100.00)

## 2024-04-29 NOTE — Progress Notes (Signed)
 "  Subjective:    Patient ID: Laura Price, female    DOB: 05/02/1950, 74 y.o.   MRN: [id number]  Chief Complaint  Patient presents with   Annual Exam    Pt states fasting     HPI Patient is in today for cpe.  Discussed the use of AI scribe software for clinical note transcription with the patient, who gave verbal consent to proceed.  History of Present Illness Laura Price is a 74 year old female who presents for an annual physical exam.  She had COVID-19 in July and has not received a COVID vaccine since September 2024. She receives annual flu shots.  Her blood pressure readings at home have been stable, with measurements such as 137/68, 134/71, and 121/78. She is currently taking losartan  and vitamin D .  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 These episodes last about 10 to 15 minutes and occurred three times in December. The aura can appear on either side, and she has experienced these migraines intermittently over the past five years. She has discussed this with her ophthalmologist.  She has a history of plantar fasciitis and recently saw a doctor for her foot. She continues to engage in physical activities such as walking and water aerobics.  Her husband recently retired, and she hopes to travel more frequently, particularly to visit her daughter and grandson in [place] . She mentions attending her grandson's sports events.  She notes stiffness in her fingers and occasional joint issues, particularly when getting up from a seated position, but states that her knees and hips are generally 'pretty good.'    Past Medical History:  Diagnosis Date   Allergic rhinitis    Allergy    Sulfa   Anemia 2005   Resolved following hysterectomy   Arthritis 2009   Neck, right big toe joint, fingers, hips   Cataract 2018   surgery 08/19/19 and 09/02/19   Heart murmur Birth   Resolved at 3 years    Past  Surgical History:  Procedure Laterality Date   ABDOMINAL HYSTERECTOMY  2005   secondary to fibroid tumors and  significant bleeding/anemia   BREAST LUMPECTOMY Left    fibroadenoma L breast   EYE SURGERY Bilateral 09/02/2019, 08/18/2019   cataract sx Dr.Steven Marcey      MYRINGOTOMY     Left   TOTAL ABDOMINAL HYSTERECTOMY W/ BILATERAL SALPINGOOPHORECTOMY      Family History  Problem Relation Age of Onset   Atrial fibrillation Mother    Breast cancer Mother 31   Thyroid  disease Mother    Cancer Mother 13       prostate   Heart disease Mother        a fib   Hypertension Mother    Varicose Veins Mother    Prostate cancer Father    Hypertension Father    Cancer Father 46       prostate   Cancer Sister 37       neuroendocrine tumor   Colon polyps Sister    Thyroid  disease Sister    Thyroid  disease Brother    Arthritis Brother    Osteoarthritis Other 65   Colon cancer Neg Hx    Esophageal cancer Neg Hx    Rectal cancer Neg Hx    Stomach cancer Neg Hx     Social History   Socioeconomic History   Marital status: Married    Spouse name: Not on file   Number  of children: Not on file   Years of education: Not on file   Highest education level: Bachelor's degree (e.g., BA, AB, BS)  Occupational History   Occupation: [employer]    Employer: [employer]    Comment: PT   Occupation: retired    Comment: 2018  Tobacco Use   Smoking status: Never   Smokeless tobacco: Never  Vaping Use   Vaping status: Never Used  Substance and Sexual Activity   Alcohol use: Yes    Alcohol/week: 3.0 standard drinks of alcohol    Types: 3 Glasses of wine per week   Drug use: No   Sexual activity: Not Currently    Partners: Male    Birth control/protection: Post-menopausal  Other Topics Concern   Not on file  Social History Narrative   Exercise-- walk almost everyday, water aerobics    Social Drivers of Health   Tobacco Use: Low Risk (04/29/2024)   Patient  History    Smoking Tobacco Use: Never    Smokeless Tobacco Use: Never    Passive Exposure: Not on file  Financial Resource Strain: Low Risk (02/18/2024)   Overall Financial Resource Strain (CARDIA)    Difficulty of Paying Living Expenses: Not hard at all  Food Insecurity: No Food Insecurity (02/18/2024)   Epic    Worried About Radiation Protection Practitioner of Food in the Last Year: Never true    Ran Out of Food in the Last Year: Never true  Transportation Needs: No Transportation Needs (02/18/2024)   Epic    Lack of Transportation (Medical): No    Lack of Transportation (Non-Medical): No  Physical Activity: Sufficiently Active (02/18/2024)   Exercise Vital Sign    Days of Exercise per Week: 5 days    Minutes of Exercise per Session: 30 min  Stress: No Stress Concern Present (02/18/2024)   [employer] of Occupational Health - Occupational Stress Questionnaire    Feeling of Stress: Not at all  Social Connections: Socially Integrated (02/18/2024)   Social Connection and Isolation Panel    Frequency of Communication with Friends and Family: More than three times a week    Frequency of Social Gatherings with Friends and Family: Three times a week    Attends Religious Services: More than 4 times per year    Active Member of Clubs or Organizations: Yes    Attends Banker Meetings: More than 4 times per year    Marital Status: Married  Catering Manager Violence: Not At Risk (10/12/2023)   Epic    Fear of Current or Ex-Partner: No    Emotionally Abused: No    Physically Abused: No    Sexually Abused: No  Depression (PHQ2-9): Low Risk (04/29/2024)   Depression (PHQ2-9)    PHQ-2 Score: 0  Alcohol Screen: Low Risk (02/18/2024)   Alcohol Screen    Last Alcohol Screening Score (AUDIT): 2  Housing: Low Risk (02/18/2024)   Epic    Unable to Pay for Housing in the Last Year: No    Number of Times Moved in the Last Year: 0    Homeless in the Last Year: No  Utilities: Not At Risk  (10/12/2023)   Epic    Threatened with loss of utilities: No  Health Literacy: Adequate Health Literacy (10/12/2023)   B1300 Health Literacy    Frequency of need for help with medical instructions: Never    Outpatient Medications Prior to Visit  Medication Sig Dispense Refill   acetaminophen  (TYLENOL ) 500 MG  tablet Take 500 mg by mouth every 6 (six) hours as needed.     calcium elemental as carbonate (TUMS ULTRA 1000) 400 MG chewable tablet Chew 1,000 mg by mouth daily.     Cholecalciferol (VITAMIN D3) 50 MCG (2000 UT) capsule Take 2,000 Units by mouth daily.     fluticasone  (FLONASE ) 50 MCG/ACT nasal spray Place 2 sprays into both nostrils daily.     losartan  (COZAAR ) 25 MG tablet Take 1 tablet (25 mg total) by mouth daily. 90 tablet 1   Multiple Minerals-Vitamins (CALCIUM-MAGNESIUM-ZINC-D3 PO) Take 2 tablets by mouth daily.     Multiple Vitamins-Minerals (MULTIVITAMIN GUMMIES WOMENS) CHEW Chew 2 tablets by mouth daily.     No facility-administered medications prior to visit.    Allergies  Allergen Reactions   Sulfonamide Derivatives Swelling and Rash    Review of Systems  Constitutional:  Negative for fever and malaise/fatigue.  HENT:  Negative for congestion.   Eyes:  Negative for blurred vision.  Respiratory:  Negative for shortness of breath.   Cardiovascular:  Negative for chest pain, palpitations and leg swelling.  Gastrointestinal:  Negative for abdominal pain, blood in stool and nausea.  Genitourinary:  Negative for dysuria and frequency.  Musculoskeletal:  Negative for falls.  Skin:  Negative for rash.  Neurological:  Negative for dizziness, loss of consciousness and headaches.  Endo/Heme/Allergies:  Negative for environmental allergies.  Psychiatric/Behavioral:  Negative for depression. The patient is not nervous/anxious.        Objective:    Physical Exam Vitals and nursing note reviewed.  Constitutional:      General: She is not in acute distress.     Appearance: Normal appearance. She is well-developed.  HENT:     Head: Normocephalic and atraumatic.     Right Ear: Tympanic membrane, ear canal and external ear normal. There is no impacted cerumen.     Left Ear: Tympanic membrane, ear canal and external ear normal. There is no impacted cerumen.     Nose: Nose normal.     Mouth/Throat:     Mouth: Mucous membranes are moist.     Pharynx: Oropharynx is clear. No oropharyngeal exudate or posterior oropharyngeal erythema.  Eyes:     General: No scleral icterus.       Right eye: No discharge.        Left eye: No discharge.     Conjunctiva/sclera: Conjunctivae normal.     Pupils: Pupils are equal, round, and reactive to light.  Neck:     Thyroid : No thyromegaly or thyroid  tenderness.     Vascular: No JVD.  Cardiovascular:     Rate and Rhythm: Normal rate and regular rhythm.     Heart sounds: Normal heart sounds. No murmur heard. Pulmonary:     Effort: Pulmonary effort is normal. No respiratory distress.     Breath sounds: Normal breath sounds.  Abdominal:     General: Bowel sounds are normal. There is no distension.     Palpations: Abdomen is soft. There is no mass.     Tenderness: There is no abdominal tenderness. There is no guarding or rebound.  Musculoskeletal:        General: Normal range of motion.     Cervical back: Normal range of motion and neck supple.     Right lower leg: No edema.     Left lower leg: No edema.  Lymphadenopathy:     Cervical: No cervical adenopathy.  Skin:    General: Skin is  warm and dry.     Findings: No erythema or rash.  Neurological:     Mental Status: She is alert and oriented to person, place, and time.     Cranial Nerves: No cranial nerve deficit.     Deep Tendon Reflexes: Reflexes are normal and symmetric.  Psychiatric:        Mood and Affect: Mood normal.        Behavior: Behavior normal.        Thought Content: Thought content normal.        Judgment: Judgment normal.     BP 134/70  (BP Location: Left Arm, Patient Position: Sitting, Cuff Size: Normal)   Pulse 63   Temp 98 F (36.7 C) (Oral)   Resp 16   Ht 5' 4.5 (1.638 m)   Wt 163 lb 3.2 oz (74 kg)   SpO2 98%   BMI 27.58 kg/m  Wt Readings from Last 3 Encounters:  04/29/24 163 lb 3.2 oz (74 kg)  02/19/24 161 lb 12.8 oz (73.4 kg)  01/25/24 163 lb 3.2 oz (74 kg)    Diabetic Foot Exam - Simple   No data filed    Lab Results  Component Value Date   WBC 4.8 01/25/2024   HGB 13.6 01/25/2024   HCT 41.0 01/25/2024   PLT 240.0 01/25/2024   GLUCOSE 99 01/25/2024   CHOL 165 01/25/2024   TRIG 76.0 01/25/2024   HDL 73.80 01/25/2024   LDLCALC 76 01/25/2024   ALT 15 01/25/2024   AST 18 01/25/2024   NA 134 (L) 01/25/2024   K 4.4 01/25/2024   CL 97 01/25/2024   CREATININE 0.55 01/25/2024   BUN 17 01/25/2024   CO2 30 01/25/2024   TSH 1.71 01/25/2024   MICROALBUR 0.4 03/23/2014    Lab Results  Component Value Date   TSH 1.71 01/25/2024   Lab Results  Component Value Date   WBC 4.8 01/25/2024   HGB 13.6 01/25/2024   HCT 41.0 01/25/2024   MCV 87.3 01/25/2024   PLT 240.0 01/25/2024   Lab Results  Component Value Date   NA 134 (L) 01/25/2024   K 4.4 01/25/2024   CO2 30 01/25/2024   GLUCOSE 99 01/25/2024   BUN 17 01/25/2024   CREATININE 0.55 01/25/2024   BILITOT 0.4 01/25/2024   ALKPHOS 98 01/25/2024   AST 18 01/25/2024   ALT 15 01/25/2024   PROT 6.7 01/25/2024   ALBUMIN 4.3 01/25/2024   CALCIUM 9.2 01/25/2024   GFR 91.65 01/25/2024   Lab Results  Component Value Date   CHOL 165 01/25/2024   Lab Results  Component Value Date   HDL 73.80 01/25/2024   Lab Results  Component Value Date   LDLCALC 76 01/25/2024   Lab Results  Component Value Date   TRIG 76.0 01/25/2024   Lab Results  Component Value Date   CHOLHDL 2 01/25/2024   No results found for: HGBA1C     Assessment & Plan:  Preventative health care Assessment & Plan: Ghm utd Check labs  See AVS Health Maintenance   Topic Date Due   COVID-19 Vaccine (7 - 2025-26 season) 12/24/2023   Medicare Annual Wellness (AWV)  10/11/2024   Mammogram  01/14/2025   Colonoscopy  01/24/2028   DTaP/Tdap/Td (4 - Td or Tdap) 02/28/2028   Pneumococcal Vaccine: 50+ Years  Completed   Influenza Vaccine  Completed   Bone Density Scan  Completed   Hepatitis C Screening  Completed   Zoster Vaccines- Shingrix  Completed   Meningococcal B Vaccine  Aged Out      Primary hypertension Assessment & Plan: Well controlled, no changes to meds. Encouraged heart healthy diet such as the DASH diet and exercise as tolerated.    Orders: -     CBC with Differential/Platelet -     Comprehensive metabolic panel with GFR -     Lipid panel -     TSH  Vitamin D  deficiency -     VITAMIN D  25 Hydroxy (Vit-D Deficiency, Fractures)  Estrogen deficiency -     DG Bone Density; Future  Assessment and Plan Assessment & Plan Primary hypertension   Blood pressure is well-controlled with losartan , with recent readings of 137/68, 134/71, and 121/78. Continue losartan  as prescribed.  Vitamin D  deficiency   Continue vitamin D  supplementation.  Ocular migraine   Experiences ocular migraines with visual aura lasting 10-15 minutes, occurring three times in December without associated headache. Symptoms resolve spontaneously. Eyes have been evaluated by an ophthalmologist. No further workup needed unless frequency increases. Monitor frequency and consider further workup such as MRI if needed.  General Health Maintenance   Mammogram is up to date. Flu shot received. Had COVID-19 infection in July and is eligible for COVID-19 vaccine. Bone density screening is due as it has been two years since the last screening. RSV vaccine is available for those 65 and over. Schedule bone density screening for September or October. Consider COVID-19 and RSV vaccines at the pharmacy.    Thor Nannini R Lowne Chase, DO  "

## 2024-04-29 NOTE — Assessment & Plan Note (Signed)
 Ghm utd Check labs  See AVS Health Maintenance  Topic Date Due   COVID-19 Vaccine (7 - 2025-26 season) 12/24/2023   Medicare Annual Wellness (AWV)  10/11/2024   Mammogram  01/14/2025   Colonoscopy  01/24/2028   DTaP/Tdap/Td (4 - Td or Tdap) 02/28/2028   Pneumococcal Vaccine: 50+ Years  Completed   Influenza Vaccine  Completed   Bone Density Scan  Completed   Hepatitis C Screening  Completed   Zoster Vaccines- Shingrix  Completed   Meningococcal B Vaccine  Aged Out

## 2024-04-29 NOTE — Assessment & Plan Note (Signed)
 Well controlled, no changes to meds. Encouraged heart healthy diet such as the DASH diet and exercise as tolerated.

## 2024-04-29 NOTE — Patient Instructions (Signed)
 Preventive Care 83 Years and Older, Female Preventive care refers to lifestyle choices and visits with your health care provider that can promote health and wellness. Preventive care visits are also called wellness exams. What can I expect for my preventive care visit? Counseling Your health care provider may ask you questions about your: Medical history, including: Past medical problems. Family medical history. Pregnancy and menstrual history. History of falls. Current health, including: Memory and ability to understand (cognition). Emotional well-being. Home life and relationship well-being. Sexual activity and sexual health. Lifestyle, including: Alcohol, nicotine or tobacco, and drug use. Access to firearms. Diet, exercise, and sleep habits. Work and work Astronomer. Sunscreen use. Safety issues such as seatbelt and bike helmet use. Physical exam Your health care provider will check your: Height and weight. These may be used to calculate your BMI (body mass index). BMI is a measurement that tells if you are at a healthy weight. Waist circumference. This measures the distance around your waistline. This measurement also tells if you are at a healthy weight and may help predict your risk of certain diseases, such as type 2 diabetes and high blood pressure. Heart rate and blood pressure. Body temperature. Skin for abnormal spots. What immunizations do I need?  Vaccines are usually given at various ages, according to a schedule. Your health care provider will recommend vaccines for you based on your age, medical history, and lifestyle or other factors, such as travel or where you work. What tests do I need? Screening Your health care provider may recommend screening tests for certain conditions. This may include: Lipid and cholesterol levels. Hepatitis C test. Hepatitis B test. HIV (human immunodeficiency virus) test. STI (sexually transmitted infection) testing, if you are at  risk. Lung cancer screening. Colorectal cancer screening. Diabetes screening. This is done by checking your blood sugar (glucose) after you have not eaten for a while (fasting). Mammogram. Talk with your health care provider about how often you should have regular mammograms. BRCA-related cancer screening. This may be done if you have a family history of breast, ovarian, tubal, or peritoneal cancers. Bone density scan. This is done to screen for osteoporosis. Talk with your health care provider about your test results, treatment options, and if necessary, the need for more tests. Follow these instructions at home: Eating and drinking  Eat a diet that includes fresh fruits and vegetables, whole grains, lean protein, and low-fat dairy products. Limit your intake of foods with high amounts of sugar, saturated fats, and salt. Take vitamin and mineral supplements as recommended by your health care provider. Do not drink alcohol if your health care provider tells you not to drink. If you drink alcohol: Limit how much you have to 0-1 drink a day. Know how much alcohol is in your drink. In the [place] one drink equals one 12 oz bottle of beer (355 mL), one 5 oz glass of wine (148 mL), or one 1 oz glass of hard liquor (44 mL). Lifestyle Brush your teeth every morning and night with fluoride toothpaste. Floss one time each day. Exercise for at least 30 minutes 5 or more days each week. Do not use any products that contain nicotine or tobacco. These products include cigarettes, chewing tobacco, and vaping devices, such as e-cigarettes. If you need help quitting, ask your health care provider. Do not use drugs. If you are sexually active, practice safe sex. Use a condom or other form of protection in order to prevent STIs. Take aspirin only as told by  your health care provider. Make sure that you understand how much to take and what form to take. Work with your health care provider to find out whether it  is safe and beneficial for you to take aspirin daily. Ask your health care provider if you need to take a cholesterol-lowering medicine (statin). Find healthy ways to manage stress, such as: Meditation, yoga, or listening to music. Journaling. Talking to a trusted person. Spending time with friends and family. Minimize exposure to UV radiation to reduce your risk of skin cancer. Safety Always wear your seat belt while driving or riding in a vehicle. Do not drive: If you have been drinking alcohol. Do not ride with someone who has been drinking. When you are tired or distracted. While texting. If you have been using any mind-altering substances or drugs. Wear a helmet and other protective equipment during sports activities. If you have firearms in your house, make sure you follow all gun safety procedures. What's next? Visit your health care provider once a year for an annual wellness visit. Ask your health care provider how often you should have your eyes and teeth checked. Stay up to date on all vaccines. This information is not intended to replace advice given to you by your health care provider. Make sure you discuss any questions you have with your health care provider. Document Revised: 10/06/2020 Document Reviewed: 10/06/2020 Elsevier Patient Education  2024 [employer].

## 2024-10-17 ENCOUNTER — Ambulatory Visit

## 2025-01-20 ENCOUNTER — Other Ambulatory Visit (HOSPITAL_BASED_OUTPATIENT_CLINIC_OR_DEPARTMENT_OTHER)

## 2025-01-20 ENCOUNTER — Ambulatory Visit (HOSPITAL_BASED_OUTPATIENT_CLINIC_OR_DEPARTMENT_OTHER)

## 2025-05-05 ENCOUNTER — Encounter: Admitting: Family Medicine
